# Patient Record
Sex: Female | Born: 1945 | Race: White | Hispanic: No | Marital: Married | Smoking: Never smoker
Health system: Southern US, Community
[De-identification: ages and names within clinical notes are randomized; demographics above are authoritative.]

## PROBLEM LIST (undated history)

## (undated) DIAGNOSIS — F419 Anxiety disorder, unspecified: Secondary | ICD-10-CM

## (undated) DIAGNOSIS — K5790 Diverticulosis of intestine, part unspecified, without perforation or abscess without bleeding: Secondary | ICD-10-CM

## (undated) DIAGNOSIS — K219 Gastro-esophageal reflux disease without esophagitis: Secondary | ICD-10-CM

## (undated) DIAGNOSIS — E785 Hyperlipidemia, unspecified: Secondary | ICD-10-CM

## (undated) HISTORY — PX: COLONOSCOPY: SHX174

## (undated) HISTORY — PX: APPENDECTOMY: SHX54

## (undated) HISTORY — PX: ABDOMINAL HYSTERECTOMY: SHX81

---

## 1986-11-28 HISTORY — PX: BREAST CYST ASPIRATION: SHX578

## 2005-08-11 ENCOUNTER — Other Ambulatory Visit: Payer: Self-pay

## 2005-08-11 ENCOUNTER — Emergency Department: Payer: Self-pay | Admitting: Emergency Medicine

## 2006-02-08 ENCOUNTER — Ambulatory Visit: Payer: Self-pay | Admitting: Unknown Physician Specialty

## 2007-05-28 ENCOUNTER — Ambulatory Visit: Payer: Self-pay | Admitting: Unknown Physician Specialty

## 2008-05-24 ENCOUNTER — Other Ambulatory Visit: Payer: Self-pay

## 2008-05-24 ENCOUNTER — Emergency Department: Payer: Self-pay | Admitting: Emergency Medicine

## 2008-07-31 ENCOUNTER — Ambulatory Visit: Payer: Self-pay | Admitting: Unknown Physician Specialty

## 2008-09-01 ENCOUNTER — Observation Stay: Payer: Self-pay | Admitting: Internal Medicine

## 2008-09-01 ENCOUNTER — Other Ambulatory Visit: Payer: Self-pay

## 2008-09-01 ENCOUNTER — Ambulatory Visit: Payer: Self-pay | Admitting: Family Medicine

## 2009-10-27 ENCOUNTER — Ambulatory Visit: Payer: Self-pay | Admitting: Unknown Physician Specialty

## 2010-09-29 ENCOUNTER — Ambulatory Visit: Payer: Self-pay | Admitting: Unknown Physician Specialty

## 2010-11-25 ENCOUNTER — Ambulatory Visit: Payer: Self-pay | Admitting: Internal Medicine

## 2010-12-14 ENCOUNTER — Ambulatory Visit: Payer: Self-pay | Admitting: Unknown Physician Specialty

## 2011-03-24 ENCOUNTER — Ambulatory Visit: Payer: Self-pay | Admitting: Internal Medicine

## 2011-10-15 ENCOUNTER — Ambulatory Visit: Payer: Self-pay

## 2012-03-02 ENCOUNTER — Ambulatory Visit: Payer: Self-pay | Admitting: Obstetrics and Gynecology

## 2012-03-07 ENCOUNTER — Ambulatory Visit: Payer: Self-pay | Admitting: Obstetrics and Gynecology

## 2012-09-10 ENCOUNTER — Ambulatory Visit: Payer: Self-pay | Admitting: Obstetrics and Gynecology

## 2013-03-09 ENCOUNTER — Emergency Department: Payer: Self-pay | Admitting: Emergency Medicine

## 2013-03-11 ENCOUNTER — Emergency Department: Payer: Self-pay | Admitting: Emergency Medicine

## 2013-03-20 ENCOUNTER — Emergency Department: Payer: Self-pay | Admitting: Emergency Medicine

## 2013-09-19 ENCOUNTER — Ambulatory Visit: Payer: Self-pay | Admitting: Obstetrics and Gynecology

## 2014-10-22 ENCOUNTER — Ambulatory Visit: Payer: Self-pay | Admitting: Obstetrics and Gynecology

## 2015-10-07 ENCOUNTER — Other Ambulatory Visit: Payer: Self-pay | Admitting: Obstetrics and Gynecology

## 2015-10-07 DIAGNOSIS — Z1231 Encounter for screening mammogram for malignant neoplasm of breast: Secondary | ICD-10-CM

## 2015-10-27 ENCOUNTER — Ambulatory Visit: Payer: Self-pay

## 2015-11-03 ENCOUNTER — Other Ambulatory Visit: Payer: Self-pay | Admitting: Obstetrics and Gynecology

## 2015-11-03 ENCOUNTER — Ambulatory Visit
Admission: RE | Admit: 2015-11-03 | Discharge: 2015-11-03 | Disposition: A | Discharge status: Home / Self Care | Payer: Medicare Other | Source: Ambulatory Visit | Attending: Obstetrics and Gynecology | Admitting: Obstetrics and Gynecology

## 2015-11-03 DIAGNOSIS — Z1231 Encounter for screening mammogram for malignant neoplasm of breast: Secondary | ICD-10-CM | POA: Diagnosis present

## 2016-11-07 ENCOUNTER — Encounter: Payer: Self-pay | Admitting: Obstetrics and Gynecology

## 2016-11-07 DIAGNOSIS — Z1231 Encounter for screening mammogram for malignant neoplasm of breast: Secondary | ICD-10-CM

## 2016-12-12 ENCOUNTER — Ambulatory Visit
Admission: RE | Admit: 2016-12-12 | Discharge: 2016-12-12 | Disposition: A | Discharge status: Home / Self Care | Payer: Medicare Other | Source: Ambulatory Visit | Attending: Obstetrics and Gynecology | Admitting: Obstetrics and Gynecology

## 2016-12-12 DIAGNOSIS — Z1231 Encounter for screening mammogram for malignant neoplasm of breast: Secondary | ICD-10-CM

## 2017-01-16 NOTE — Progress Notes (Signed)
This encounter was created in error - please disregard.

## 2017-07-14 ENCOUNTER — Ambulatory Visit
Admission: EM | Admit: 2017-07-14 | Discharge: 2017-07-14 | Disposition: A | Discharge status: Other Acute Inpatient Hospital | Payer: Medicare Other | Attending: Family Medicine | Admitting: Family Medicine

## 2017-07-14 ENCOUNTER — Emergency Department
Admission: EM | Admit: 2017-07-14 | Discharge: 2017-07-14 | Disposition: A | Discharge status: Home / Self Care | Payer: Medicare Other | Attending: Student in an Organized Health Care Education/Training Program | Admitting: Student in an Organized Health Care Education/Training Program

## 2017-07-14 ENCOUNTER — Encounter: Payer: Self-pay | Admitting: *Deleted

## 2017-07-14 ENCOUNTER — Emergency Department: Payer: Medicare Other

## 2017-07-14 DIAGNOSIS — R1031 Right lower quadrant pain: Secondary | ICD-10-CM | POA: Diagnosis present

## 2017-07-14 DIAGNOSIS — K5732 Diverticulitis of large intestine without perforation or abscess without bleeding: Secondary | ICD-10-CM | POA: Insufficient documentation

## 2017-07-14 DIAGNOSIS — R1030 Lower abdominal pain, unspecified: Secondary | ICD-10-CM

## 2017-07-14 DIAGNOSIS — R197 Diarrhea, unspecified: Secondary | ICD-10-CM

## 2017-07-14 LAB — URINALYSIS, COMPLETE (UACMP) WITH MICROSCOPIC
BILIRUBIN URINE: NEGATIVE
Bacteria, UA: NONE SEEN
GLUCOSE, UA: NEGATIVE mg/dL
HGB URINE DIPSTICK: NEGATIVE
Ketones, ur: 5 mg/dL — AB
NITRITE: NEGATIVE
PH: 8 (ref 5.0–8.0)
Protein, ur: NEGATIVE mg/dL
SPECIFIC GRAVITY, URINE: 1.018 (ref 1.005–1.030)

## 2017-07-14 LAB — COMPREHENSIVE METABOLIC PANEL
ALBUMIN: 4.1 g/dL (ref 3.5–5.0)
ALK PHOS: 61 U/L (ref 38–126)
ALT: 19 U/L (ref 14–54)
ANION GAP: 10 (ref 5–15)
AST: 21 U/L (ref 15–41)
BILIRUBIN TOTAL: 0.4 mg/dL (ref 0.3–1.2)
BUN: 19 mg/dL (ref 6–20)
CALCIUM: 9.5 mg/dL (ref 8.9–10.3)
CO2: 27 mmol/L (ref 22–32)
CREATININE: 0.73 mg/dL (ref 0.44–1.00)
Chloride: 99 mmol/L — ABNORMAL LOW (ref 101–111)
GFR calc Af Amer: >60 mL/min (ref >60)
GFR calc non Af Amer: >60 mL/min (ref >60)
GLUCOSE: 115 mg/dL — AB (ref 65–99)
Potassium: 3.9 mmol/L (ref 3.5–5.1)
SODIUM: 136 mmol/L (ref 135–145)
TOTAL PROTEIN: 8.1 g/dL (ref 6.5–8.1)

## 2017-07-14 LAB — CBC
HCT: 39.1 % (ref 35.0–47.0)
HEMOGLOBIN: 12.7 g/dL (ref 12.0–16.0)
MCH: 30 pg (ref 26.0–34.0)
MCHC: 32.5 g/dL (ref 32.0–36.0)
MCV: 92.2 fL (ref 80.0–100.0)
PLATELETS: 372 10*3/uL (ref 150–440)
RBC: 4.24 MIL/uL (ref 3.80–5.20)
RDW: 14.7 % — AB (ref 11.5–14.5)
WBC: 20.4 10*3/uL — ABNORMAL HIGH (ref 3.6–11.0)

## 2017-07-14 LAB — LIPASE, BLOOD: Lipase: 28 U/L (ref 11–51)

## 2017-07-14 MED ORDER — IOPAMIDOL (ISOVUE-300) INJECTION 61%
100.0000 mL | Freq: Once | INTRAVENOUS | Status: AC | PRN
  Administered 2017-07-14: 100 mL via INTRAVENOUS

## 2017-07-14 MED ORDER — IOPAMIDOL (ISOVUE-M 300) INJECTION 61%: 15.0000 mL | Freq: Once | INTRAMUSCULAR | Status: DC | PRN

## 2017-07-14 MED ORDER — METRONIDAZOLE 500 MG PO TABS
500.0000 mg | ORAL_TABLET | Freq: Once | ORAL | Status: AC
  Administered 2017-07-14: 500 mg via ORAL
  Filled 2017-07-14: qty 1

## 2017-07-14 MED ORDER — SODIUM CHLORIDE 0.9 % IV BOLUS (SEPSIS)
500.0000 mL | Freq: Once | INTRAVENOUS | Status: AC
  Administered 2017-07-14: 500 mL via INTRAVENOUS

## 2017-07-14 MED ORDER — KETOROLAC TROMETHAMINE 60 MG/2ML IM SOLN
60.0000 mg | Freq: Once | INTRAMUSCULAR | Status: AC
  Administered 2017-07-14: 60 mg via INTRAMUSCULAR

## 2017-07-14 MED ORDER — HYDROCODONE-ACETAMINOPHEN 5-325 MG PO TABS
1.0000 | ORAL_TABLET | ORAL | 0 refills | Status: DC | PRN
Start: 2017-07-14 — End: 2019-07-21

## 2017-07-14 MED ORDER — CIPROFLOXACIN HCL 500 MG PO TABS
500.0000 mg | ORAL_TABLET | Freq: Once | ORAL | Status: AC
  Administered 2017-07-14: 500 mg via ORAL
  Filled 2017-07-14: qty 1

## 2017-07-14 MED ORDER — METRONIDAZOLE 250 MG PO TABS: 250.0000 mg | ORAL_TABLET | Freq: Three times a day (TID) | ORAL | 0 refills | Status: AC

## 2017-07-14 MED ORDER — CIPROFLOXACIN HCL 500 MG PO TABS: 500.0000 mg | ORAL_TABLET | Freq: Two times a day (BID) | ORAL | 0 refills | Status: AC

## 2017-07-14 MED ORDER — ONDANSETRON HCL 4 MG PO TABS: 4.0000 mg | ORAL_TABLET | Freq: Every day | ORAL | 0 refills | Status: AC | PRN

## 2017-07-14 NOTE — ED Triage Notes (Signed)
Pt states abdominal cramping started x 11 days ago. She had diarrhea resolved after taking imodium and last episode was a couple of days ago. Pt states that she has had persistent, painful cramping that as not resolved w/ taking OTC tylenol. Pt has recently taking augmentin intermittently for sinus infection which she states she has 3-4 times a summer. Pt did not take full course of abx that was rx'd to her but takes them PRN when she feels badly. Pt also was treated w/ prednisone for same sinus infection. Pt has a ride, A&Ox 4, in some distress. Seen at [facility] UC today for same complaint and given medication for pain which has helped.

## 2017-07-14 NOTE — ED Provider Notes (Signed)
[facility] Emergency Department Provider Note    First MD Initiated Contact with Patient 07/14/17 2052     (approximate)  I have reviewed the triage vital signs and the nursing notes.   HISTORY  Chief Complaint Abdominal Pain    HPI Rebecca Morris is a 71 y.o. female resents with chief complaint of crampy right-sided abdominal pain associated with intermittent diarrhea. No constipation. No measured fevers. Patient was recently started on prednisone taper for sinusitis.She states that she has not noted any sort of change in the character of pain with moving her bowels but does state the pain is worse after eating. Denies any dysuria or hematuria.   History reviewed. No pertinent past medical history. Family History  Problem Relation Age of Onset  . Breast cancer Mother 16   Past Surgical History:  Procedure Laterality Date  . ABDOMINAL HYSTERECTOMY    . BREAST CYST ASPIRATION Left 1988   asp cyst   There are no active problems to display for this patient.     Prior to Admission medications   Not on File    Allergies Macrodantin [nitrofurantoin macrocrystal]    Social History Social History  Substance Use Topics  . Smoking status: Never Smoker  . Smokeless tobacco: Never Used  . Alcohol use No    Review of Systems Patient denies headaches, rhinorrhea, blurry vision, numbness, shortness of breath, chest pain, edema, cough, abdominal pain, nausea, vomiting, diarrhea, dysuria, fevers, rashes or hallucinations unless otherwise stated above in HPI. ____________________________________________   PHYSICAL EXAM:  VITAL SIGNS: Vitals:   07/14/17 2100 07/14/17 2146  BP: (!) 146/61 (!) 154/65  Pulse: 90 86  Resp: 18 18  Temp:    SpO2: 99% 100%    Constitutional: Alert and oriented. Well appearing and in no acute distress. Eyes: Conjunctivae are normal.  Head: Atraumatic. Nose: No congestion/rhinnorhea. Mouth/Throat: Mucous  membranes are moist.   Neck: No stridor. Painless ROM.  Cardiovascular: Normal rate, regular rhythm. Grossly normal heart sounds.  Good peripheral circulation. Respiratory: Normal respiratory effort.  No retractions. Lungs CTAB. Gastrointestinal: Soft with + right sided abdominal ttp. No distention. No abdominal bruits. No CVA tenderness. Genitourinary:  Musculoskeletal: No lower extremity tenderness nor edema.  No joint effusions. Neurologic:  Normal speech and language. No gross focal neurologic deficits are appreciated. No facial droop Skin:  Skin is warm, dry and intact. No rash noted. Psychiatric: Mood and affect are normal. Speech and behavior are normal.  ____________________________________________   LABS (all labs ordered are listed, but only abnormal results are displayed)  Results for orders placed or performed during the hospital encounter of 07/14/17 (from the past 24 hour(s))  Lipase, blood     Status: None   Collection Time: 07/14/17  7:27 PM  Result Value Ref Range   Lipase 28 11 - 51 U/L  Comprehensive metabolic panel     Status: Abnormal   Collection Time: 07/14/17  7:27 PM  Result Value Ref Range   Sodium 136 135 - 145 mmol/L   Potassium 3.9 3.5 - 5.1 mmol/L   Chloride 99 (L) 101 - 111 mmol/L   CO2 27 22 - 32 mmol/L   Glucose, Bld 115 (H) 65 - 99 mg/dL   BUN 19 6 - 20 mg/dL   Creatinine, Ser 2.95 0.44 - 1.00 mg/dL   Calcium 9.5 8.9 - 18.8 mg/dL   Total Protein 8.1 6.5 - 8.1 g/dL   Albumin 4.1 3.5 - 5.0 g/dL  AST 21 15 - 41 U/L   ALT 19 14 - 54 U/L   Alkaline Phosphatase 61 38 - 126 U/L   Total Bilirubin 0.4 0.3 - 1.2 mg/dL   GFR calc non Af Amer >60 >60 mL/min   GFR calc Af Amer >60 >60 mL/min   Anion gap 10 5 - 15  CBC     Status: Abnormal   Collection Time: 07/14/17  7:27 PM  Result Value Ref Range   WBC 20.4 (H) 3.6 - 11.0 K/uL   RBC 4.24 3.80 - 5.20 MIL/uL   Hemoglobin 12.7 12.0 - 16.0 g/dL   HCT 00.7 62.2 - 63.3 %   MCV 92.2 80.0 - 100.0 fL    MCH 30.0 26.0 - 34.0 pg   MCHC 32.5 32.0 - 36.0 g/dL   RDW 35.4 (H) 56.2 - 56.3 %   Platelets 372 150 - 440 K/uL  Urinalysis, Complete w Microscopic     Status: Abnormal   Collection Time: 07/14/17  7:27 PM  Result Value Ref Range   Color, Urine YELLOW (A) YELLOW   APPearance CLEAR (A) CLEAR   Specific Gravity, Urine 1.018 1.005 - 1.030   pH 8.0 5.0 - 8.0   Glucose, UA NEGATIVE NEGATIVE mg/dL   Hgb urine dipstick NEGATIVE NEGATIVE   Bilirubin Urine NEGATIVE NEGATIVE   Ketones, ur 5 (A) NEGATIVE mg/dL   Protein, ur NEGATIVE NEGATIVE mg/dL   Nitrite NEGATIVE NEGATIVE   Leukocytes, UA MODERATE (A) NEGATIVE   RBC / HPF 0-5 0 - 5 RBC/hpf   WBC, UA 6-30 0 - 5 WBC/hpf   Bacteria, UA NONE SEEN NONE SEEN   Squamous Epithelial / LPF 0-5 (A) NONE SEEN   ____________________________________________ ____________________________________________  RADIOLOGY  I personally reviewed all radiographic images ordered to evaluate for the above acute complaints and reviewed radiology reports and findings.  These findings were personally discussed with the patient.  Please see medical record for radiology report.  ____________________________________________   PROCEDURES  Procedure(s) performed:  Procedures    Critical Care performed: no ____________________________________________   INITIAL IMPRESSION / ASSESSMENT AND PLAN / ED COURSE  Pertinent labs & imaging results that were available during my care of the patient were reviewed by me and considered in my medical decision making (see chart for details).  DDX: diveticulitis, appy, colitis, stone, pyelo  Rebecca Morris is a 71 y.o. who presents to the ED with symptoms described above. Liver shows evidence of leukocytosis. Patient afebrile and otherwise well appearing. Part of leukocytosis may be secondary to recent prednisone taper. Based on her abdominal exam pain CT imaging ordered for the above differential. No evidence of UTI or stone.  Patient has evidence of diverticulitis without abscess or perforation.  Patient was able to tolerate oral hydration as well as oral antibiotics. Pain was controlled in the ER with oral medications therefore do feel the patient is stable for discharge home.  Have discussed with the patient and available family all diagnostics and treatments performed thus far and all questions were answered to the best of my ability. The patient demonstrates understanding and agreement with plan.       ____________________________________________   FINAL CLINICAL IMPRESSION(S) / ED DIAGNOSES  Final diagnoses:  Right lower quadrant abdominal pain  Diverticulitis large intestine w/o perforation or abscess w/o bleeding      NEW MEDICATIONS STARTED DURING THIS VISIT:  New Prescriptions   No medications on file     Note:  This document was prepared  using Conservation officer, historic buildings and may include unintentional dictation errors.    Willy Eddy, MD 07/14/17 [phone]

## 2017-07-14 NOTE — ED Provider Notes (Signed)
[facility]    CSN: [id number] Arrival date & time: 07/14/17  1748     History   Chief Complaint Chief Complaint  Patient presents with  . Abdominal Pain    HPI Rebecca Morris is a 71 y.o. female.   HPI  This is a 71 year old female who presents with abdominal pain described as cramping that she's had for the last 10 days off and on. She had diarrhea last week which was watery but had some form to it no blood and no mucus. Initially last week she thought she may have had some food poisoning did not have any nausea or vomiting. She's had no fever or chills. Recently she states that she's had no energy and she's afraid to eat solid foods because it precipitates the cramping. Today she awoke without any pain initially but by 8 am the pain started and has persisted. Yesterday had no problems allowing her to  shop most of the day. The pain today has been constant and intense rated as a 9 out of 10. She has been eating instant breakfast and yogurt only. Occasionally she would have buttered toast. Surgical past history is significant for breast cyst aspiration on the left and abdominal hysterectomy         History reviewed. No pertinent past medical history.  There are no active problems to display for this patient.   Past Surgical History:  Procedure Laterality Date  . ABDOMINAL HYSTERECTOMY    . BREAST CYST ASPIRATION Left 1988   asp cyst    OB History    No data available       Home Medications    Prior to Admission medications   Not on File    Family History Family History  Problem Relation Age of Onset  . Breast cancer Mother 5    Social History Social History  Substance Use Topics  . Smoking status: Never Smoker  . Smokeless tobacco: Never Used  . Alcohol use No     Allergies   Macrodantin [nitrofurantoin macrocrystal]   Review of Systems Review of Systems  Constitutional: Positive for activity change, appetite change and fatigue.  Negative for chills, diaphoresis and fever.  Gastrointestinal: Positive for abdominal pain and diarrhea. Negative for anal bleeding, blood in stool, constipation, nausea, rectal pain and vomiting.  All other systems reviewed and are negative.    Physical Exam Triage Vital Signs ED Triage Vitals  Enc Vitals Group     BP 07/14/17 1803 (!) 152/68     Pulse Rate 07/14/17 1803 (!) 102     Resp 07/14/17 1803 15     Temp 07/14/17 1803 97.7 F (36.5 C)     Temp Source 07/14/17 1803 Oral     SpO2 07/14/17 1803 100 %     Weight 07/14/17 1801 147 lb (66.7 kg)     Height 07/14/17 1801 5\' 4"  (1.626 m)     Head Circumference --      Peak Flow --      Pain Score 07/14/17 1801 8     Pain Loc --      Pain Edu? --      Excl. in GC? --    No data found.   Updated Vital Signs BP (!) 152/68 (BP Location: Left Arm)   Pulse (!) 102   Temp 97.7 F (36.5 C) (Oral)   Resp 15   Ht 5\' 4"  (1.626 m)   Wt 147 lb (66.7 kg)  SpO2 100%   BMI 25.23 kg/m   Visual Acuity Right Eye Distance:   Left Eye Distance:   Bilateral Distance:    Right Eye Near:   Left Eye Near:    Bilateral Near:     Physical Exam  Constitutional: She is oriented to person, place, and time. She appears well-developed and well-nourished. No distress.  HENT:  Head: Normocephalic.  Eyes: Pupils are equal, round, and reactive to light.  Neck: Normal range of motion.  Abdominal: Soft. She exhibits no mass. There is tenderness. There is no rebound and no guarding.  Patient has tenderness to palpation over the left and right lower quadrants. There is no rebound and no guarding present.  Musculoskeletal: Normal range of motion.  Neurological: She is alert and oriented to person, place, and time.  Skin: Skin is warm and dry. She is not diaphoretic.  Psychiatric: She has a normal mood and affect. Her behavior is normal. Judgment and thought content normal.  Nursing note and vitals reviewed.    UC Treatments / Results    Labs (all labs ordered are listed, but only abnormal results are displayed) Labs Reviewed - No data to display  EKG  EKG Interpretation None       Radiology No results found.  Procedures Procedures (including critical care time)  Medications Ordered in UC Medications  ketorolac (TORADOL) injection 60 mg (60 mg Intramuscular Given 07/14/17 1836)     Initial Impression / Assessment and Plan / UC Course  I have reviewed the triage vital signs and the nursing notes.  Pertinent labs & imaging results that were available during my care of the patient were reviewed by me and considered in my medical decision making (see chart for details).     Discussion with the patient and her husband. Because of the pain that she has had with   A constant nature ,and the severity  ,have recommended that she be seen at a higher level of care for a thorough evaluation and treatment. She is agreeable to this. She will be transported by her husband to [facility] ED which she chose. Prior to leaving she was given a Toradol injection 60 mg IM and held in our facility for 20 minutes. Her pain level decreased from a 9-7. She continued having constant pain in the lower abdomen. This was the reason for  transporting her to a higher level. She left our facility in stable condition. Reported to the triage nurse at [facility] of the patient's condition  as well as her being transported by her husband. At the present time the patient does not seem to have an acute abdomen but continues with constant pain.  Final Clinical Impressions(s) / UC Diagnoses   Final diagnoses:  Lower abdominal pain    New Prescriptions There are no discharge medications for this patient.    Controlled Substance Prescriptions [place] Controlled Substance Registry consulted? Not Applicable   Lutricia Feil, PA-C 07/14/17 1916

## 2017-07-14 NOTE — Discharge Instructions (Signed)

## 2017-07-14 NOTE — ED Notes (Signed)
Patient shows no signs of adverse reaction to medication at this time.  

## 2017-07-14 NOTE — ED Triage Notes (Signed)
Patient complains of abdominal pain, nausea, cramps x 10 days. Patient states that she has had diarrhea last week. Patient states that she thought she had food poison last week. Patient states that she has no energy and states that she has not had solid food in over one week. Patient states that the lower abdominal cramping is constant and intense.

## 2017-07-20 ENCOUNTER — Telehealth: Payer: Self-pay | Admitting: Gastroenterology

## 2017-07-20 NOTE — Telephone Encounter (Signed)
Left voice message for patient to call and schedule for a hospital follow up diverticulitis large intesstine and right lower quad pain.

## 2017-07-24 ENCOUNTER — Ambulatory Visit (INDEPENDENT_AMBULATORY_CARE_PROVIDER_SITE_OTHER): Payer: Medicare Other | Admitting: Gastroenterology

## 2017-07-24 ENCOUNTER — Encounter: Payer: Self-pay | Admitting: Gastroenterology

## 2017-07-24 ENCOUNTER — Telehealth: Payer: Self-pay

## 2017-07-24 VITALS — BP 111/75 | HR 96 | Temp 97.5°F | Ht 64.0 in | Wt 150.0 lb

## 2017-07-24 DIAGNOSIS — K5792 Diverticulitis of intestine, part unspecified, without perforation or abscess without bleeding: Secondary | ICD-10-CM | POA: Diagnosis not present

## 2017-07-24 MED ORDER — DICYCLOMINE HCL 10 MG PO CAPS: 10.0000 mg | ORAL_CAPSULE | Freq: Three times a day (TID) | ORAL | 0 refills | Status: DC

## 2017-07-24 NOTE — Addendum Note (Signed)
Addended by: Ardyth Man on: 07/24/2017 03:06 PM   Modules accepted: Orders

## 2017-07-24 NOTE — Addendum Note (Signed)
Addended by: Ardyth Man on: 07/24/2017 03:43 PM   Modules accepted: Orders, SmartSet

## 2017-07-24 NOTE — Telephone Encounter (Signed)
Gastroenterology Pre-Procedure Review  Request Date: 10/18 Requesting Physician: Dr. Tobi Bastos  PATIENT REVIEW QUESTIONS: The patient responded to the following health history questions as indicated:    1. Are you having any GI issues? yes (diverticulitis) 2. Do you have a personal history of Polyps? no 3. Do you have a family history of Colon Cancer or Polyps? no 4. Diabetes Mellitus? no 5. Joint replacements in the past 12 months?no 6. Major health problems in the past 3 months?no 7. Any artificial heart valves, MVP, or defibrillator?no    MEDICATIONS & ALLERGIES:    Patient reports the following regarding taking any anticoagulation/antiplatelet therapy:   Plavix, Coumadin, Eliquis, Xarelto, Lovenox, Pradaxa, Brilinta, or Effient? no Aspirin? no  Patient confirms/reports the following medications:  Current Outpatient Prescriptions  Medication Sig Dispense Refill  . atorvastatin (LIPITOR) 20 MG tablet Take by mouth.    . cetirizine (ZYRTEC) 10 MG tablet Take by mouth.    . ciprofloxacin (CIPRO) 500 MG tablet Take 1 tablet (500 mg total) by mouth 2 (two) times daily. 20 tablet 0  . [employer] BUDESONIDE 32 MCG/ACT nasal spray PLACE 1 SPRAY INTO BOTH NOSTRILS ONCE DAILY.  11  . dicyclomine (BENTYL) 10 MG capsule Take 1 capsule (10 mg total) by mouth 4 (four) times daily -  before meals and at bedtime. 120 capsule 0  . estradiol (ESTRACE) 1 MG tablet Take 1 mg by mouth daily.  1  . FLUoxetine (PROZAC) 20 MG tablet Take 20 mg by mouth daily.  1  . HYDROcodone-acetaminophen (NORCO) 5-325 MG tablet Take 1 tablet by mouth every 4 (four) hours as needed for moderate pain. 6 tablet 0  . ondansetron (ZOFRAN) 4 MG tablet Take 1 tablet (4 mg total) by mouth daily as needed for nausea or vomiting. (Patient not taking: Reported on 07/24/2017) 14 tablet 0   No current facility-administered medications for this visit.     Patient confirms/reports the following allergies:  Allergies  Allergen Reactions  .  Macrodantin [Nitrofurantoin Macrocrystal] Itching    No orders of the defined types were placed in this encounter.   AUTHORIZATION INFORMATION Primary Insurance: 1D#: Group #:  Secondary Insurance: 1D#: Group #:  SCHEDULE INFORMATION: Date: 10/18 Time: Location: [facility]

## 2017-07-24 NOTE — Progress Notes (Signed)
[name] MD, MRCP(U.K) [address]  [address]  [place], [place] [address]  Main: [phone]  Fax: [phone]   Gastroenterology Consultation  Referring Provider:    Emergency room  Primary Care Physician:  Sharee Pimple, CNM Primary Gastroenterologist:  Dr. [name]  Reason for Consultation:     Diverticulitis        HPI:   Rebecca Morris is a 71 y.o. y/o female referred for consultation & management  by Dr. Sharee Pimple, CNM.     She was seen at the ER on 07/14/17 for rt sided abdominal pain. CT scan of the abdomen showed acute diverticulitis of the recto sigmoid junction , WCC was 20.4. Was given a course of Ciprofloxacin and Flagyl for about 10 days, some hydrocodone. Denies any fever. Since Er feels "much better" still has some cramping early this morning , no diarrhea. Been eating soft foods. Last colonoscopy was in 2012 and had no polyps. No family history of colon polyps or cancer. No change in bowel habits, no rectal habits, no significant weight loss. She is retired and was a Engineer, agricultural at [employer].  No NSAID's.    No past medical history on file.  Past Surgical History:  Procedure Laterality Date  . ABDOMINAL HYSTERECTOMY    . BREAST CYST ASPIRATION Left 1988   asp cyst    Prior to Admission medications   Medication Sig Start Date End Date Taking? Authorizing Provider  atorvastatin (LIPITOR) 20 MG tablet Take by mouth. 06/23/17  Yes [provider]  cetirizine (ZYRTEC) 10 MG tablet Take by mouth.   Yes [provider]  ciprofloxacin (CIPRO) 500 MG tablet Take 1 tablet (500 mg total) by mouth 2 (two) times daily. 07/14/17 07/24/17 Yes Willy Eddy, MD  [employer] BUDESONIDE 32 MCG/ACT nasal spray PLACE 1 SPRAY INTO BOTH NOSTRILS ONCE DAILY. 06/30/17  Yes [provider]  estradiol (ESTRACE) 1 MG tablet Take 1 mg by mouth daily. 07/20/17  Yes [provider]  FLUoxetine (PROZAC) 20 MG tablet Take 20 mg by mouth daily.  06/09/17  Yes [provider]  HYDROcodone-acetaminophen (NORCO) 5-325 MG tablet Take 1 tablet by mouth every 4 (four) hours as needed for moderate pain. 07/14/17  Yes Willy Eddy, MD  ondansetron (ZOFRAN) 4 MG tablet Take 1 tablet (4 mg total) by mouth daily as needed for nausea or vomiting. Patient not taking: Reported on 07/24/2017 07/14/17 07/14/18  Willy Eddy, MD    Family History  Problem Relation Age of Onset  . Breast cancer Mother 96     Social History  Substance Use Topics  . Smoking status: Never Smoker  . Smokeless tobacco: Never Used  . Alcohol use No    Allergies as of 07/24/2017 - Review Complete 07/24/2017  Allergen Reaction Noted  . Macrodantin [nitrofurantoin macrocrystal] Itching 07/14/2017    Review of Systems:    All systems reviewed and negative except where noted in HPI.   Physical Exam:  BP 111/75 (BP Location: Left Arm, Patient Position: Sitting, Cuff Size: Normal)   Pulse 96   Temp (!) 97.5 F (36.4 C) (Oral)   Ht 5\' 4"  (1.626 m)   Wt 150 lb (68 kg)   BMI 25.75 kg/m  No LMP recorded. Patient has had a hysterectomy. Psych:  Alert and cooperative. Normal mood and affect. General:   Alert,  Well-developed, well-nourished, pleasant and cooperative in NAD Head:  Normocephalic and atraumatic. Eyes:  Sclera clear, no icterus.  Conjunctiva pink. Ears:  Normal auditory acuity. Nose:  No deformity, discharge, or lesions. Mouth:  No deformity or lesions,oropharynx pink & moist. Neck:  Supple; no masses or thyromegaly. Lungs:  Respirations even and unlabored.  Clear throughout to auscultation.   No wheezes, crackles, or rhonchi. No acute distress. Heart:  Regular rate and rhythm; no murmurs, clicks, rubs, or gallops. Abdomen:  Normal bowel sounds.  No bruits.  Soft, non-tender and non-distended without masses, hepatosplenomegaly or hernias noted.  No guarding or rebound tenderness.    Neurologic:  Alert and oriented x3;  grossly normal  neurologically. Skin:  Intact without significant lesions or rashes. No jaundice. Lymph Nodes:  No significant cervical adenopathy. Psych:  Alert and cooperative. Normal mood and affect.  Imaging Studies: Ct Abdomen Pelvis W Contrast  Result Date: 07/14/2017 CLINICAL DATA:  Abdominal pain and diarrhea EXAM: CT ABDOMEN AND PELVIS WITH CONTRAST TECHNIQUE: Multidetector CT imaging of the abdomen and pelvis was performed using the standard protocol following bolus administration of intravenous contrast. CONTRAST:  ISOVUE-300 IOPAMIDOL (ISOVUE-300) INJECTION 61% COMPARISON:  None. FINDINGS: Lower chest: No pulmonary nodules or pleural effusion. No visible pericardial effusion. Hepatobiliary: Normal hepatic contours and density. No visible biliary dilatation. Normal gallbladder. Pancreas: Normal contours without ductal dilatation. No peripancreatic fluid collection. Spleen: Normal. Adrenals/Urinary Tract: --Adrenal glands: Normal. --Right kidney/ureter: No hydronephrosis or perinephric stranding. No nephrolithiasis. No obstructing ureteral stones. --Left kidney/ureter: No hydronephrosis or perinephric stranding. No nephrolithiasis. No obstructing ureteral stones. --Urinary bladder: Unremarkable. Stomach/Bowel: --Stomach/Duodenum: No hiatal hernia or other gastric abnormality. Normal duodenal course and caliber. --Small bowel: No dilatation or inflammation. --Colon: There is rectosigmoid diverticulosis with acute inflammation at the rectosigmoid junction. No free intraperitoneal air or abscess formation. --Appendix: Normal. Vascular/Lymphatic: Normal course and caliber of the major abdominal vessels. No abdominal or pelvic lymphadenopathy. Reproductive: Status post hysterectomy. No adnexal mass. Musculoskeletal. No bony spinal canal stenosis or focal osseous abnormality. There is a medium-sized disc extrusion with mild superior migration at L4-L5. Moderate left L3-L4 and mild to moderate left L4-L5 neural  foraminal stenosis. Other: None. IMPRESSION: 1. Acute rectosigmoid junction diverticulitis. No free intraperitoneal air or abscess formation. 2. Lower lumbar degenerative disc disease with mild-to-moderate left neural foraminal narrowing at L3-L4 and L4-L5. Electronically Signed   By: Deatra Robinson M.D.   On: 07/14/2017 21:36    Assessment and Plan:   Tarica J Altemose is a 71 y.o. y/o female has been referred for acute diverticulitis. First episode,Plan for a colonoscopy in 8 weeks   I have discussed alternative options, risks & benefits,  which include, but are not limited to, bleeding, infection, perforation,respiratory complication & drug reaction.  The patient agrees with this plan & written consent will be obtained.     Follow up as needed   Dr [name] MD,MRCP(U.K)

## 2017-08-20 ENCOUNTER — Other Ambulatory Visit: Payer: Self-pay | Admitting: Gastroenterology

## 2017-08-20 DIAGNOSIS — K5792 Diverticulitis of intestine, part unspecified, without perforation or abscess without bleeding: Secondary | ICD-10-CM

## 2017-08-23 ENCOUNTER — Ambulatory Visit: Payer: Medicare Other | Admitting: Gastroenterology

## 2017-09-13 ENCOUNTER — Other Ambulatory Visit: Payer: Self-pay

## 2017-09-13 DIAGNOSIS — K5792 Diverticulitis of intestine, part unspecified, without perforation or abscess without bleeding: Secondary | ICD-10-CM

## 2017-09-13 MED ORDER — DICYCLOMINE HCL 10 MG PO CAPS: 10.0000 mg | ORAL_CAPSULE | Freq: Three times a day (TID) | ORAL | 3 refills | Status: DC

## 2017-09-14 ENCOUNTER — Other Ambulatory Visit: Payer: Self-pay

## 2017-09-14 ENCOUNTER — Encounter
Admission: RE | Disposition: A | Discharge status: Home / Self Care | Payer: Self-pay | Source: Ambulatory Visit | Attending: Gastroenterology

## 2017-09-14 ENCOUNTER — Ambulatory Visit: Payer: Medicare Other | Admitting: Anesthesiology

## 2017-09-14 ENCOUNTER — Ambulatory Visit
Admission: RE | Admit: 2017-09-14 | Discharge: 2017-09-14 | Disposition: A | Discharge status: Home / Self Care | Payer: Medicare Other | Source: Ambulatory Visit | Attending: Gastroenterology | Admitting: Gastroenterology

## 2017-09-14 DIAGNOSIS — K5730 Diverticulosis of large intestine without perforation or abscess without bleeding: Secondary | ICD-10-CM | POA: Insufficient documentation

## 2017-09-14 DIAGNOSIS — K219 Gastro-esophageal reflux disease without esophagitis: Secondary | ICD-10-CM | POA: Diagnosis not present

## 2017-09-14 DIAGNOSIS — Z79899 Other long term (current) drug therapy: Secondary | ICD-10-CM | POA: Diagnosis not present

## 2017-09-14 DIAGNOSIS — K621 Rectal polyp: Secondary | ICD-10-CM

## 2017-09-14 DIAGNOSIS — K5792 Diverticulitis of intestine, part unspecified, without perforation or abscess without bleeding: Secondary | ICD-10-CM | POA: Diagnosis not present

## 2017-09-14 HISTORY — PX: COLONOSCOPY WITH PROPOFOL: SHX5780

## 2017-09-14 HISTORY — DX: Gastro-esophageal reflux disease without esophagitis: K21.9

## 2017-09-14 SURGERY — COLONOSCOPY WITH PROPOFOL
Anesthesia: General

## 2017-09-14 MED ORDER — PROPOFOL 500 MG/50ML IV EMUL
INTRAVENOUS | Status: DC | PRN
  Administered 2017-09-14: 150 ug/kg/min via INTRAVENOUS

## 2017-09-14 MED ORDER — SODIUM CHLORIDE 0.9 % IV SOLN
INTRAVENOUS | Status: DC
  Administered 2017-09-14: 07:00:00 via INTRAVENOUS

## 2017-09-14 MED ORDER — PROPOFOL 500 MG/50ML IV EMUL
INTRAVENOUS | Status: AC
  Filled 2017-09-14: qty 50

## 2017-09-14 MED ORDER — LIDOCAINE HCL (PF) 2 % IJ SOLN
INTRAMUSCULAR | Status: AC
  Filled 2017-09-14: qty 10

## 2017-09-14 MED ORDER — PHENYLEPHRINE HCL 10 MG/ML IJ SOLN
INTRAMUSCULAR | Status: AC
  Filled 2017-09-14: qty 1

## 2017-09-14 MED ORDER — PROPOFOL 10 MG/ML IV BOLUS
INTRAVENOUS | Status: DC | PRN
  Administered 2017-09-14: 60 mg via INTRAVENOUS
  Administered 2017-09-14 (×2): 20 mg via INTRAVENOUS

## 2017-09-14 MED ORDER — LIDOCAINE HCL (CARDIAC) 20 MG/ML IV SOLN
INTRAVENOUS | Status: DC | PRN
  Administered 2017-09-14: 50 mg via INTRAVENOUS

## 2017-09-14 NOTE — Anesthesia Post-op Follow-up Note (Signed)
Anesthesia QCDR form completed.        

## 2017-09-14 NOTE — Op Note (Signed)
[facility] Gastroenterology Patient Name: Rebecca Morris Procedure Date: 09/14/2017 7:25 AM MRN: [id number] Account #: [id number] Date of Birth: May 28, 1946 Admit Type: Outpatient Age: [age] Room: [facility] ENDO ROOM 3 Gender: Female Note Status: Finalized Procedure:            Colonoscopy Indications:          Follow-up of diverticulitis Providers:            Wyline MoodKiran Romina Divirgilio MD, MD Referring MD:         Sharee Pimplearon W. Jones CNM, CNM (Referring MD) Medicines:            Monitored Anesthesia Care Complications:        No immediate complications. Procedure:            Pre-Anesthesia Assessment:                       - Prior to the procedure, a History and Physical was                        performed, and patient medications, allergies and                        sensitivities were reviewed. The patient's tolerance of                        previous anesthesia was reviewed.                       - The risks and benefits of the procedure and the                        sedation options and risks were discussed with the                        patient. All questions were answered and informed                        consent was obtained.                       - ASA Grade Assessment: III - A patient with severe                        systemic disease.                       After obtaining informed consent, the colonoscope was                        passed under direct vision. Throughout the procedure,                        the patient's blood pressure, pulse, and oxygen                        saturations were monitored continuously. The                        Colonoscope was introduced through the anus and  advanced to the the cecum, identified by the                        appendiceal orifice, IC valve and transillumination.                        The colonoscopy was performed with ease. The patient                        tolerated the procedure well. The quality of  the bowel                        preparation was fair. Findings:      The perianal and digital rectal examinations were normal.      A single medium-mouthed diverticulum was found in the sigmoid colon.      A 3 mm polyp was found in the rectum. The polyp was sessile. The polyp       was removed with a cold biopsy forceps. Resection and retrieval were       complete.      The exam was otherwise without abnormality on direct and retroflexion       views. Impression:           - Preparation of the colon was fair.                       - Diverticulosis in the sigmoid colon.                       - One 3 mm polyp in the rectum, removed with a cold                        biopsy forceps. Resected and retrieved.                       - The examination was otherwise normal on direct and                        retroflexion views. Recommendation:       - Discharge patient to home (with escort).                       - Resume previous diet.                       - Continue present medications.                       - Await pathology results.                       - The prep was adequate to r/o large polyps or masses                        but inadequate to rule out flat or tiny polyps                        especially in the right colon . She is due for a  colonoscopy in 2019 based on prior report and hence we                        can repeat it in 2019 for evaluation of polyps Procedure Code(s):    --- Professional ---                       [phone], Colonoscopy, flexible; with biopsy, single or                        multiple Diagnosis Code(s):    --- Professional ---                       K62.1, Rectal polyp                       K57.32, Diverticulitis of large intestine without                        perforation or abscess without bleeding                       K57.30, Diverticulosis of large intestine without                        perforation or abscess without  bleeding CPT copyright 2016 American Medical Association. All rights reserved. The codes documented in this report are preliminary and upon coder review may  be revised to meet current compliance requirements. [name], MD [name] MD, MD 09/14/2017 8:07:08 AM This report has been signed electronically. Number of Addenda: 0 Note Initiated On: 09/14/2017 7:25 AM Scope Withdrawal Time: 0 hours 11 minutes 3 seconds  Total Procedure Duration: 0 hours 17 minutes 48 seconds       [facility]

## 2017-09-14 NOTE — Anesthesia Postprocedure Evaluation (Signed)
Anesthesia Post Note  Patient: Rebecca Morris  Procedure(s) Performed: COLONOSCOPY WITH PROPOFOL (N/A )  Patient location during evaluation: Endoscopy Anesthesia Type: General Level of consciousness: awake and alert Pain management: pain level controlled Vital Signs Assessment: post-procedure vital signs reviewed and stable Respiratory status: spontaneous breathing, nonlabored ventilation, respiratory function stable and patient connected to nasal cannula oxygen Cardiovascular status: blood pressure returned to baseline and stable Postop Assessment: no apparent nausea or vomiting Anesthetic complications: no     Last Vitals:  Vitals:   09/14/17 0828 09/14/17 0838  BP: 96/86 132/64  Pulse: 63 65  Resp: 19 17  Temp:    SpO2: 100% 99%    Last Pain:  Vitals:   09/14/17 0808  TempSrc: Tympanic                 Cleda MccreedyJoseph K Kenitra Leventhal

## 2017-09-14 NOTE — Transfer of Care (Signed)
`  Immediate Anesthesia Transfer of Care Note  Patient: Rebecca Morris  Procedure(s) Performed: COLONOSCOPY WITH PROPOFOL (N/A )  Patient Location: PACU  Anesthesia Type:General  Level of Consciousness: sedated and responds to stimulation  Airway & Oxygen Therapy: Patient Spontanous Breathing and Patient connected to nasal cannula oxygen  Post-op Assessment: Report given to RN and Post -op Vital signs reviewed and stable  Post vital signs: Reviewed and stable  Last Vitals:  Vitals:   09/14/17 0705 09/14/17 0809  BP: (!) 151/75 (!) 102/48  Pulse: 82 67  Resp: 16 (!) 9  Temp: 36.4 C   SpO2: 100% 100%    Last Pain:  Vitals:   09/14/17 0705  TempSrc: Oral         Complications: No apparent anesthesia complications

## 2017-09-14 NOTE — H&P (Signed)
Wyline MoodKiran Teshia Mahone MD [address] [place], [place] [address] Phone: [phone] Fax : [phone]  Primary Care Physician:  Sharee PimpleJones, Caron W, CNM Primary Gastroenterologist:  Dr. Wyline MoodKiran Lilliemae Fruge   Pre-Procedure History & Physical: HPI:  Rebecca Morris is a 71 y.o. female is here for an colonoscopy.   Past Medical History:  Diagnosis Date  . GERD (gastroesophageal reflux disease)     Past Surgical History:  Procedure Laterality Date  . ABDOMINAL HYSTERECTOMY    . BREAST CYST ASPIRATION Left 1988   asp cyst  . COLONOSCOPY      Prior to Admission medications   Medication Sig Start Date End Date Taking? Authorizing Provider  estradiol (ESTRACE) 1 MG tablet Take 1 mg by mouth daily. 07/20/17  Yes [provider]  FLUoxetine (PROZAC) 20 MG tablet Take 20 mg by mouth daily. 06/09/17  Yes [provider]  atorvastatin (LIPITOR) 20 MG tablet Take by mouth. 06/23/17   [provider]  cetirizine (ZYRTEC) 10 MG tablet Take by mouth.    [provider]  [facility] BUDESONIDE 32 MCG/ACT nasal spray PLACE 1 SPRAY INTO BOTH NOSTRILS ONCE DAILY. 06/30/17   [provider]  dicyclomine (BENTYL) 10 MG capsule Take 1 capsule (10 mg total) by mouth 4 (four) times daily -  before meals and at bedtime. 09/13/17 10/13/17  Wyline MoodAnna, Gustavo Dispenza, MD  HYDROcodone-acetaminophen (NORCO) 5-325 MG tablet Take 1 tablet by mouth every 4 (four) hours as needed for moderate pain. Patient not taking: Reported on 09/14/2017 07/14/17   Willy Eddyobinson, Patrick, MD  ondansetron (ZOFRAN) 4 MG tablet Take 1 tablet (4 mg total) by mouth daily as needed for nausea or vomiting. Patient not taking: Reported on 07/24/2017 07/14/17 07/14/18  Willy Eddyobinson, Patrick, MD    Allergies as of 07/25/2017 - Review Complete 07/24/2017  Allergen Reaction Noted  . Macrodantin [nitrofurantoin macrocrystal] Itching 07/14/2017    Family History  Problem Relation Age of Onset  . Breast cancer Mother 5560    Social History    Social History  . Marital status: Married    Spouse name: N/A  . Number of children: N/A  . Years of education: N/A   Occupational History  . Not on file.   Social History Main Topics  . Smoking status: Never Smoker  . Smokeless tobacco: Never Used  . Alcohol use No  . Drug use: No  . Sexual activity: Yes    Birth control/ protection: Post-menopausal   Other Topics Concern  . Not on file   Social History Narrative  . No narrative on file    Review of Systems: See HPI, otherwise negative ROS  Physical Exam: BP (!) 151/75   Pulse 82   Temp 97.6 F (36.4 C) (Oral)   Resp 16   Ht 5\' 3"  (1.6 m)   Wt 147 lb (66.7 kg)   SpO2 100%   BMI 26.04 kg/m  General:   Alert,  pleasant and cooperative in NAD Head:  Normocephalic and atraumatic. Neck:  Supple; no masses or thyromegaly. Lungs:  Clear throughout to auscultation.    Heart:  Regular rate and rhythm. Abdomen:  Soft, nontender and nondistended. Normal bowel sounds, without guarding, and without rebound.   Neurologic:  Alert and  oriented x4;  grossly normal neurologically.  Impression/Plan: Rebecca Morris is here for an colonoscopy to be performed for diverticulitis  Risks, benefits, limitations, and alternatives regarding  endoscopy have been reviewed with the patient.  Questions have been answered.  All  parties agreeable.   [name], MD  09/14/2017, 7:41 AM

## 2017-09-14 NOTE — Anesthesia Preprocedure Evaluation (Signed)
Anesthesia Evaluation  Patient identified by MRN, date of birth, ID band Patient awake    Reviewed: Allergy & Precautions, H&P , NPO status , Patient's Chart, lab work & pertinent test results  History of Anesthesia Complications (+) PONV and history of anesthetic complications  Airway Mallampati: III  TM Distance: <3 FB Neck ROM: limited    Dental  (+) Poor Dentition, Chipped, Missing   Pulmonary neg pulmonary ROS, neg shortness of breath,           Cardiovascular Exercise Tolerance: Good (-) angina(-) Past MI and (-) DOE negative cardio ROS       Neuro/Psych negative neurological ROS  negative psych ROS   GI/Hepatic negative GI ROS, Neg liver ROS, GERD  Medicated and Controlled,  Endo/Other  negative endocrine ROS  Renal/GU negative Renal ROS  negative genitourinary   Musculoskeletal   Abdominal   Peds  Hematology negative hematology ROS (+)   Anesthesia Other Findings Past Medical History: No date: GERD (gastroesophageal reflux disease)  Past Surgical History: No date: ABDOMINAL HYSTERECTOMY 1988: BREAST CYST ASPIRATION; Left     Comment:  asp cyst No date: COLONOSCOPY  BMI    Body Mass Index:  26.04 kg/m      Reproductive/Obstetrics negative OB ROS                            Anesthesia Physical Anesthesia Plan  ASA: III  Anesthesia Plan: General   Post-op Pain Management:    Induction: Intravenous  PONV Risk Score and Plan: Propofol infusion  Airway Management Planned: Natural Airway and Nasal Cannula  Additional Equipment:   Intra-op Plan:   Post-operative Plan:   Informed Consent: I have reviewed the patients History and Physical, chart, labs and discussed the procedure including the risks, benefits and alternatives for the proposed anesthesia with the patient or authorized representative who has indicated his/her understanding and acceptance.   Dental  Advisory Given  Plan Discussed with: Anesthesiologist, CRNA and Surgeon  Anesthesia Plan Comments: (Patient consented for risks of anesthesia including but not limited to:  - adverse reactions to medications - risk of intubation if required - damage to teeth, lips or other oral mucosa - sore throat or hoarseness - Damage to heart, brain, lungs or loss of life  Patient voiced understanding.)       Anesthesia Quick Evaluation

## 2017-09-15 LAB — SURGICAL PATHOLOGY

## 2017-09-18 ENCOUNTER — Telehealth: Payer: Self-pay

## 2017-09-18 NOTE — Telephone Encounter (Signed)
LVM for patient callback for results per Dr. Tobi BastosAnna.   Benign polyp taken out, since prep was inadequate recommend repeat colonoscopy in 2019 when she was originally due

## 2017-09-18 NOTE — Telephone Encounter (Signed)
-----   Message from Wyline MoodKiran Anna, MD sent at 09/18/2017 10:07 AM EDT ----- Benign polyp taken out, since prep was inadequate recommend repeat colonoscopy in 2019 when she was originally due

## 2017-09-19 ENCOUNTER — Encounter: Payer: Self-pay | Admitting: Gastroenterology

## 2017-09-25 ENCOUNTER — Other Ambulatory Visit: Payer: Self-pay

## 2017-09-25 DIAGNOSIS — K5792 Diverticulitis of intestine, part unspecified, without perforation or abscess without bleeding: Secondary | ICD-10-CM

## 2017-09-25 MED ORDER — DICYCLOMINE HCL 10 MG PO CAPS: 10.0000 mg | ORAL_CAPSULE | Freq: Three times a day (TID) | ORAL | 0 refills | Status: DC

## 2017-11-15 ENCOUNTER — Other Ambulatory Visit: Payer: Self-pay | Admitting: Gastroenterology

## 2017-11-15 DIAGNOSIS — K5792 Diverticulitis of intestine, part unspecified, without perforation or abscess without bleeding: Secondary | ICD-10-CM

## 2017-12-05 ENCOUNTER — Other Ambulatory Visit: Payer: Self-pay | Admitting: Obstetrics and Gynecology

## 2017-12-05 DIAGNOSIS — Z1231 Encounter for screening mammogram for malignant neoplasm of breast: Secondary | ICD-10-CM

## 2017-12-20 ENCOUNTER — Ambulatory Visit
Admission: RE | Admit: 2017-12-20 | Discharge: 2017-12-20 | Disposition: A | Discharge status: Home / Self Care | Payer: Medicare Other | Source: Ambulatory Visit | Attending: Obstetrics and Gynecology | Admitting: Obstetrics and Gynecology

## 2017-12-20 DIAGNOSIS — Z1231 Encounter for screening mammogram for malignant neoplasm of breast: Secondary | ICD-10-CM | POA: Insufficient documentation

## 2018-01-01 ENCOUNTER — Telehealth: Payer: Self-pay | Admitting: Gastroenterology

## 2018-01-01 NOTE — Telephone Encounter (Signed)
Patient called and states she had a colonoscopy done 09-14-17 by Dr Tobi BastosAnna. She experienced cramping and gas on Saturday 11-29-17 but no diarrhea.She just wanted to let Dr Tobi BastosAnna know.

## 2018-01-10 ENCOUNTER — Other Ambulatory Visit: Payer: Self-pay

## 2018-01-15 ENCOUNTER — Other Ambulatory Visit: Payer: Self-pay | Admitting: Gastroenterology

## 2018-01-15 DIAGNOSIS — K5792 Diverticulitis of intestine, part unspecified, without perforation or abscess without bleeding: Secondary | ICD-10-CM

## 2018-01-28 ENCOUNTER — Other Ambulatory Visit: Payer: Self-pay | Admitting: Gastroenterology

## 2018-01-28 DIAGNOSIS — K5792 Diverticulitis of intestine, part unspecified, without perforation or abscess without bleeding: Secondary | ICD-10-CM

## 2018-04-19 ENCOUNTER — Other Ambulatory Visit: Payer: Self-pay | Admitting: Gastroenterology

## 2018-04-19 DIAGNOSIS — K5792 Diverticulitis of intestine, part unspecified, without perforation or abscess without bleeding: Secondary | ICD-10-CM

## 2018-07-14 ENCOUNTER — Other Ambulatory Visit: Payer: Self-pay | Admitting: Gastroenterology

## 2018-07-14 DIAGNOSIS — K5792 Diverticulitis of intestine, part unspecified, without perforation or abscess without bleeding: Secondary | ICD-10-CM

## 2018-07-19 ENCOUNTER — Other Ambulatory Visit: Payer: Self-pay

## 2018-07-19 DIAGNOSIS — K5792 Diverticulitis of intestine, part unspecified, without perforation or abscess without bleeding: Secondary | ICD-10-CM

## 2018-07-19 NOTE — Telephone Encounter (Signed)
Refill for Dicyclomine has been sent to pt's pharmacy. Left vm letting pt know this was sent.

## 2018-07-19 NOTE — Telephone Encounter (Signed)
Pt left vm she states she needs refill on rx dycyclimine 10 mg 4 times a day send to [place] [facility] please call pt once send

## 2018-09-13 ENCOUNTER — Telehealth: Payer: Self-pay | Admitting: Gastroenterology

## 2018-09-13 NOTE — Telephone Encounter (Signed)
Pt left vm to schedule a f/u colonoscopy with Dr. Tobi Bastos

## 2018-09-14 ENCOUNTER — Telehealth: Payer: Self-pay

## 2018-09-14 NOTE — Telephone Encounter (Signed)
LVM for pt to call office to schedule her repeat colonoscopy with Dr. Tobi Bastos.  Reviewed colonoscopy report from 09/14/17.  This report indicated that pt had Rectal Polyps K62.1.    Thanks [employer]

## 2018-11-08 ENCOUNTER — Telehealth: Payer: Self-pay

## 2018-11-08 NOTE — Telephone Encounter (Signed)
-----   Message from Avie ArenasMichelle S Versie Soave, [place] sent at 09/25/2018  4:53 PM EDT ----- Regarding: Schedule Colonoscopy 2020 Patient would like her repeat colonoscopy with Dr. Tobi BastosAnna scheduled after the beginning of the year.  See chart message for diagnosis code.  Thanks [employer]

## 2018-11-08 NOTE — Telephone Encounter (Signed)
Contacted patient to see if she would like to go ahead and get scheduled for her repeat colonoscopy with Dr. Tobi BastosAnna in January 2020.  Last colonoscopy was on 09/14/17 with Dr. Tobi BastosAnna, he suggested repeat colonoscopy in 2019.  Sessile polyp was noted on colonoscopy (Z86.010).  Thanks [employer]

## 2018-11-12 ENCOUNTER — Other Ambulatory Visit: Payer: Self-pay | Admitting: Obstetrics and Gynecology

## 2018-11-12 DIAGNOSIS — Z1231 Encounter for screening mammogram for malignant neoplasm of breast: Secondary | ICD-10-CM

## 2018-11-14 ENCOUNTER — Telehealth: Payer: Self-pay

## 2018-11-14 NOTE — Telephone Encounter (Signed)
PT left vm for Rebecca Morris returning her call to schedule a colonoscopy for January or feburay

## 2018-11-16 ENCOUNTER — Other Ambulatory Visit: Payer: Self-pay

## 2018-11-16 ENCOUNTER — Telehealth: Payer: Self-pay

## 2018-11-16 DIAGNOSIS — Z86010 Personal history of colonic polyps: Secondary | ICD-10-CM

## 2018-11-16 NOTE — Telephone Encounter (Signed)
Pt is calling  To schedule a recolonoscopy

## 2018-11-16 NOTE — Telephone Encounter (Signed)
LVM returning patients call to schedule her for her repeat colonoscopy with Dr. Tobi BastosAnna due to her history of colon polyps.  Thanks [employer]

## 2018-11-19 NOTE — Telephone Encounter (Signed)
Returned patients phone call LVM for her to call office back regarding rescheduling her colonoscopy.  She is currently scheduled for 12/11/2018.  Thanks [employer]

## 2019-01-10 ENCOUNTER — Ambulatory Visit
Admission: RE | Admit: 2019-01-10 | Discharge: 2019-01-10 | Disposition: A | Discharge status: Home / Self Care | Payer: Medicare Other | Source: Ambulatory Visit | Attending: Obstetrics and Gynecology | Admitting: Obstetrics and Gynecology

## 2019-01-10 DIAGNOSIS — Z1231 Encounter for screening mammogram for malignant neoplasm of breast: Secondary | ICD-10-CM | POA: Diagnosis present

## 2019-01-14 ENCOUNTER — Encounter: Admission: RE | Payer: Self-pay | Source: Home / Self Care

## 2019-01-14 ENCOUNTER — Ambulatory Visit: Admission: RE | Admit: 2019-01-14 | Payer: Medicare Other | Source: Home / Self Care | Admitting: Gastroenterology

## 2019-01-14 ENCOUNTER — Other Ambulatory Visit: Payer: Self-pay | Admitting: Obstetrics and Gynecology

## 2019-01-14 ENCOUNTER — Telehealth: Payer: Self-pay | Admitting: Gastroenterology

## 2019-01-14 DIAGNOSIS — R928 Other abnormal and inconclusive findings on diagnostic imaging of breast: Secondary | ICD-10-CM

## 2019-01-14 DIAGNOSIS — N6489 Other specified disorders of breast: Secondary | ICD-10-CM

## 2019-01-14 SURGERY — COLONOSCOPY WITH PROPOFOL
Anesthesia: General

## 2019-01-14 NOTE — Telephone Encounter (Signed)
Noted. [facility] has been notified.  

## 2019-01-14 NOTE — Telephone Encounter (Signed)
Pt left vm to cancel her procedure for 01/14/19 she has a bug she caught due to working part time she states she will call us back to r/s

## 2019-01-16 ENCOUNTER — Telehealth: Payer: Self-pay | Admitting: Gastroenterology

## 2019-01-16 NOTE — Telephone Encounter (Signed)
Pt left vm she states she needs to r/s her procedure she cancelled due to the flu please call her to r/s this

## 2019-01-21 ENCOUNTER — Ambulatory Visit
Admission: RE | Admit: 2019-01-21 | Discharge: 2019-01-21 | Disposition: A | Discharge status: Home / Self Care | Payer: Medicare Other | Source: Ambulatory Visit | Attending: Obstetrics and Gynecology | Admitting: Obstetrics and Gynecology

## 2019-01-21 DIAGNOSIS — R928 Other abnormal and inconclusive findings on diagnostic imaging of breast: Secondary | ICD-10-CM | POA: Diagnosis present

## 2019-01-21 DIAGNOSIS — N6489 Other specified disorders of breast: Secondary | ICD-10-CM | POA: Insufficient documentation

## 2019-01-31 NOTE — Telephone Encounter (Signed)
Called pt regarding her request to reschedule her colonoscopy.  Unable to contact, LVM to return call

## 2019-02-01 NOTE — Telephone Encounter (Signed)
Called pt to inform her that I will need to speak with her directly to agree on an exact date for her procedure.  Unable to contact, LVM to return call

## 2019-02-01 NOTE — Telephone Encounter (Signed)
PATIENT RETURNED CALL TO R/S COLONOSCOPY WOULD LIKE TO SCHEDULE AFTER April. JUST LEAVE DETAILED MESSAGE ON PHONE. sHE WILL NEED NEW SCRIPT FOR BOWEL PREP [facility] & NEEDS INSTRUCTIONS

## 2019-02-05 ENCOUNTER — Other Ambulatory Visit: Payer: Self-pay | Admitting: Gastroenterology

## 2019-02-05 DIAGNOSIS — K5792 Diverticulitis of intestine, part unspecified, without perforation or abscess without bleeding: Secondary | ICD-10-CM

## 2019-02-11 NOTE — Telephone Encounter (Signed)
Pt left vm for Nurse to call her to r/s her procedure she would like to r/s after all this virsus stuff has settled please call pt

## 2019-07-21 ENCOUNTER — Encounter: Payer: Self-pay | Admitting: Emergency Medicine

## 2019-07-21 ENCOUNTER — Other Ambulatory Visit: Payer: Self-pay

## 2019-07-21 ENCOUNTER — Ambulatory Visit
Admission: EM | Admit: 2019-07-21 | Discharge: 2019-07-21 | Disposition: A | Discharge status: Home / Self Care | Payer: Medicare Other | Attending: Emergency Medicine | Admitting: Emergency Medicine

## 2019-07-21 DIAGNOSIS — R1032 Left lower quadrant pain: Secondary | ICD-10-CM

## 2019-07-21 DIAGNOSIS — Z20828 Contact with and (suspected) exposure to other viral communicable diseases: Secondary | ICD-10-CM | POA: Diagnosis not present

## 2019-07-21 DIAGNOSIS — K5732 Diverticulitis of large intestine without perforation or abscess without bleeding: Secondary | ICD-10-CM

## 2019-07-21 DIAGNOSIS — Z79899 Other long term (current) drug therapy: Secondary | ICD-10-CM | POA: Diagnosis not present

## 2019-07-21 DIAGNOSIS — Z803 Family history of malignant neoplasm of breast: Secondary | ICD-10-CM | POA: Diagnosis not present

## 2019-07-21 DIAGNOSIS — K219 Gastro-esophageal reflux disease without esophagitis: Secondary | ICD-10-CM | POA: Diagnosis not present

## 2019-07-21 LAB — COMPREHENSIVE METABOLIC PANEL
ALT: 25 U/L (ref 0–44)
AST: 25 U/L (ref 15–41)
Albumin: 3.8 g/dL (ref 3.5–5.0)
Alkaline Phosphatase: 78 U/L (ref 38–126)
Anion gap: 11 (ref 5–15)
BUN: 28 mg/dL — ABNORMAL HIGH (ref 8–23)
CO2: 24 mmol/L (ref 22–32)
Calcium: 9.2 mg/dL (ref 8.9–10.3)
Chloride: 102 mmol/L (ref 98–111)
Creatinine, Ser: 0.92 mg/dL (ref 0.44–1.00)
GFR calc Af Amer: >60 mL/min (ref >60)
GFR calc non Af Amer: >60 mL/min (ref >60)
Glucose, Bld: 99 mg/dL (ref 70–99)
Potassium: 4.3 mmol/L (ref 3.5–5.1)
Sodium: 137 mmol/L (ref 135–145)
Total Bilirubin: 0.3 mg/dL (ref 0.3–1.2)
Total Protein: 8 g/dL (ref 6.5–8.1)

## 2019-07-21 LAB — URINALYSIS, COMPLETE (UACMP) WITH MICROSCOPIC
Bacteria, UA: NONE SEEN
Bilirubin Urine: NEGATIVE
Glucose, UA: NEGATIVE mg/dL
Hgb urine dipstick: NEGATIVE
Ketones, ur: NEGATIVE mg/dL
Nitrite: NEGATIVE
Protein, ur: NEGATIVE mg/dL
RBC / HPF: NONE SEEN RBC/hpf (ref 0–5)
Specific Gravity, Urine: 1.015 (ref 1.005–1.030)
pH: 6 (ref 5.0–8.0)

## 2019-07-21 LAB — CBC WITH DIFFERENTIAL/PLATELET
Abs Immature Granulocytes: 0.04 10*3/uL (ref 0.00–0.07)
Basophils Absolute: 0.1 10*3/uL (ref 0.0–0.1)
Basophils Relative: 0 %
Eosinophils Absolute: 0.1 10*3/uL (ref 0.0–0.5)
Eosinophils Relative: 1 %
HCT: 39.2 % (ref 36.0–46.0)
Hemoglobin: 12.9 g/dL (ref 12.0–15.0)
Immature Granulocytes: 0 %
Lymphocytes Relative: 12 %
Lymphs Abs: 1.8 10*3/uL (ref 0.7–4.0)
MCH: 31.6 pg (ref 26.0–34.0)
MCHC: 32.9 g/dL (ref 30.0–36.0)
MCV: 96.1 fL (ref 80.0–100.0)
Monocytes Absolute: 1.1 10*3/uL — ABNORMAL HIGH (ref 0.1–1.0)
Monocytes Relative: 7 %
Neutro Abs: 11.4 10*3/uL — ABNORMAL HIGH (ref 1.7–7.7)
Neutrophils Relative %: 80 %
Platelets: 357 10*3/uL (ref 150–400)
RBC: 4.08 MIL/uL (ref 3.87–5.11)
RDW: 13.7 % (ref 11.5–15.5)
WBC: 14.5 10*3/uL — ABNORMAL HIGH (ref 4.0–10.5)
nRBC: 0 % (ref 0.0–0.2)

## 2019-07-21 MED ORDER — AMOXICILLIN-POT CLAVULANATE 875-125 MG PO TABS: 1.0000 | ORAL_TABLET | Freq: Two times a day (BID) | ORAL | 0 refills | Status: DC

## 2019-07-21 NOTE — ED Triage Notes (Signed)
Patient states that she was treated for Sinus Infection on 07/02/19.  Patient states that her last dose of antibiotic will be tomorrow.  Patient also reports left sided abdominal pain on Tuesday.  Patient reports history of diverticulitis.  Patient denies N/V/D.  Patient reports loss of appetite.  Patient denies fevers.

## 2019-07-21 NOTE — ED Provider Notes (Signed)
[facility]    CSN: [id number] Arrival date & time: 07/21/19  1018      History   Chief Complaint Chief Complaint  Patient presents with  . Fatigue    HPI Rebecca Morris is a 73 y.o. female.   HPI  73 year old female presents with 2 separate issues.  First issue is that sinusitis that she had developed in early August around the fourth or fifth but put off having a treated tool just recently.  [facility] and was prescribed amoxicillin 875 twice daily.  She has 1 more day of therapy left.  She states that despite the antibiotic she still does not feel well at all complaining mostly of fatigue but denies any coughing or significance sneezing.  States that the symptoms that she has had has been very similar to previous sinus infections.  Had no fever or chills.  Her second issue is that of left lower quadrant discomfort that happens on occasion but is not long lasting.  She has been having normal bowel movements.  She denies any hematochezia or melena.  She has not had any constipation.  Her last bowel movement was earlier this morning which she classifies as normal.  She has a history of diverticulitis that has been treated in the past with Cipro and Flagyl.  Is on a anti-diverticulitis type dietary regimen.  Follows this closely.  Symptoms developed on Tuesday 5 days prior to this visit.  Pain does not last long is very sharp.  She has had some mild nausea on one occasion but has had no vomiting.  Again she has not had any fevers.  She does report a loss of appetite.        Past Medical History:  Diagnosis Date  . GERD (gastroesophageal reflux disease)     There are no active problems to display for this patient.   Past Surgical History:  Procedure Laterality Date  . ABDOMINAL HYSTERECTOMY    . APPENDECTOMY    . BREAST CYST ASPIRATION Left 1988   asp cyst  . COLONOSCOPY    . COLONOSCOPY WITH PROPOFOL N/A 09/14/2017   Procedure: COLONOSCOPY  WITH PROPOFOL;  Surgeon: Wyline MoodAnna, Kiran, MD;  [facility];  Service: Gastroenterology;  Laterality: N/A;    OB History   No obstetric history on file.      Home Medications    Prior to Admission medications   Medication Sig Start Date End Date Taking? Authorizing Provider  atorvastatin (LIPITOR) 20 MG tablet Take by mouth. 06/23/17  Yes [provider]  cetirizine (ZYRTEC) 10 MG tablet Take by mouth.   Yes [provider]  [facility] BUDESONIDE 32 MCG/ACT nasal spray PLACE 1 SPRAY INTO BOTH NOSTRILS ONCE DAILY. 06/30/17  Yes [provider]  dicyclomine (BENTYL) 10 MG capsule TAKE 1 CAPSULE (10 MG TOTAL) BY MOUTH 4 (FOUR) TIMES DAILY - BEFORE MEALS AND AT BEDTIME. 02/12/19 08/11/19 Yes Wyline MoodAnna, Kiran, MD  estradiol (ESTRACE) 1 MG tablet Take 1 mg by mouth daily. 07/20/17  Yes [provider]  FLUoxetine (PROZAC) 20 MG tablet Take 20 mg by mouth daily. 06/09/17  Yes [provider]  Multiple Vitamin (MULTIVITAMIN) tablet Take 1 tablet by mouth daily.   Yes [provider]  amoxicillin-clavulanate (AUGMENTIN) 875-125 MG tablet Take 1 tablet by mouth every 12 (twelve) hours. 07/21/19   Lutricia Feiloemer, Darden Flemister P, PA-C  dicyclomine (BENTYL) 10 MG capsule TAKE 1 CAPSULE (10 MG TOTAL) BY MOUTH 4 (FOUR)  TIMES DAILY - BEFORE MEALS AND AT BEDTIME. 11/15/17 12/15/17  Wyline MoodAnna, Kiran, MD  dicyclomine (BENTYL) 10 MG capsule Take 1 capsule (10 mg total) by mouth at bedtime as needed for spasms. 01/29/18 02/28/18  Wyline MoodAnna, Kiran, MD    Family History Family History  Problem Relation Age of Onset  . Breast cancer Mother 1960    Social History Social History   Tobacco Use  . Smoking status: Never Smoker  . Smokeless tobacco: Never Used  Substance Use Topics  . Alcohol use: No  . Drug use: No     Allergies   Macrodantin [nitrofurantoin macrocrystal]   Review of Systems Review of Systems  Constitutional: Positive for activity change, appetite change and fatigue.  Negative for chills and fever.  Respiratory: Negative for cough and shortness of breath.   Gastrointestinal: Positive for abdominal pain. Negative for abdominal distention, anal bleeding, blood in stool, constipation, diarrhea, nausea and vomiting.  All other systems reviewed and are negative.    Physical Exam Triage Vital Signs ED Triage Vitals  Enc Vitals Group     BP 07/21/19 1032 129/88     Pulse Rate 07/21/19 1032 94     Resp 07/21/19 1032 14     Temp 07/21/19 1032 98.7 F (37.1 C)     Temp Source 07/21/19 1032 Oral     SpO2 07/21/19 1032 98 %     Weight 07/21/19 1029 150 lb (68 kg)     Height 07/21/19 1029 5\' 4"  (1.626 m)     Head Circumference --      Peak Flow --      Pain Score 07/21/19 1029 1     Pain Loc --      Pain Edu? --      Excl. in GC? --    No data found.  Updated Vital Signs BP 129/88 (BP Location: Left Arm)   Pulse 94   Temp 98.7 F (37.1 C) (Oral)   Resp 14   Ht 5\' 4"  (1.626 m)   Wt 150 lb (68 kg)   SpO2 98%   BMI 25.75 kg/m   Visual Acuity Right Eye Distance:   Left Eye Distance:   Bilateral Distance:    Right Eye Near:   Left Eye Near:    Bilateral Near:     Physical Exam Vitals signs and nursing note reviewed. Exam conducted with a chaperone present.  Constitutional:      General: She is not in acute distress.    Appearance: Normal appearance. She is not ill-appearing, toxic-appearing or diaphoretic.  HENT:     Head: Normocephalic and atraumatic.     Nose: Nose normal.     Mouth/Throat:     Mouth: Mucous membranes are moist.  Eyes:     Conjunctiva/sclera: Conjunctivae normal.     Pupils: Pupils are equal, round, and reactive to light.  Neck:     Musculoskeletal: Normal range of motion and neck supple.  Pulmonary:     Effort: Pulmonary effort is normal.     Breath sounds: Normal breath sounds.  Abdominal:     General: Abdomen is flat. Bowel sounds are normal. There is no distension.     Palpations: Abdomen is soft. There is  no mass.     Tenderness: There is abdominal tenderness. There is no right CVA tenderness, left CVA tenderness, guarding or rebound.     Hernia: No hernia is present.     Comments: Patient has tenderness to palpation along the  left lower quadrant.  There is no guarding there is no rebound present.  Bowel sounds are normal.  There is no distention present.  Musculoskeletal: Normal range of motion.  Skin:    General: Skin is warm.  Neurological:     General: No focal deficit present.     Mental Status: She is alert and oriented to person, place, and time.  Psychiatric:        Mood and Affect: Mood normal.        Behavior: Behavior normal.        Thought Content: Thought content normal.        Judgment: Judgment normal.      UC Treatments / Results  Labs (all labs ordered are listed, but only abnormal results are displayed) Labs Reviewed  CBC WITH DIFFERENTIAL/PLATELET - Abnormal; Notable for the following components:      Result Value   WBC 14.5 (*)    Neutro Abs 11.4 (*)    Monocytes Absolute 1.1 (*)    All other components within normal limits  COMPREHENSIVE METABOLIC PANEL - Abnormal; Notable for the following components:   BUN 28 (*)    All other components within normal limits  URINALYSIS, COMPLETE (UACMP) WITH MICROSCOPIC - Abnormal; Notable for the following components:   Leukocytes,Ua TRACE (*)    All other components within normal limits  NOVEL CORONAVIRUS, NAA (HOSPITAL ORDER, SEND-OUT TO REF LAB)    EKG   Radiology No results found.  Procedures Procedures (including critical care time)  Medications Ordered in UC Medications - No data to display  Initial Impression / Assessment and Plan / UC Course  I have reviewed the triage vital signs and the nursing notes.  Pertinent labs & imaging results that were available during my care of the patient were reviewed by me and considered in my medical decision making (see chart for details).   Reviewed my findings  and as well as the laboratory results with the patient.  She has a mildly elevated white count with a shift.  This could be from her diverticulitis.  Her sinusitis symptoms has not been bothering her since being on the amoxicillin.  Physical exam today does show tenderness in the left lower quadrant consistent with diverticulitis but her abdominal exam is otherwise negative with no guarding, normal bowel sounds and no rebound.  Tenderness is sharply localized to the left lower quadrant.  She has no fever.  Does have fatigue.  I have advised her that we will place her on Augmentin for better coverage of possible diverticulitis.  I have also advised her that if she develops any fever or if the pain worsens or is not improving she should go to the emergency room for possible imaging and further evaluation.  Is agreeable to this.  I further recommended that she follow-up with her primary care physicians at [facility] next week.   Final Clinical Impressions(s) / UC Diagnoses   Final diagnoses:  Colicky LLQ abdominal pain  Diverticulitis of colon     Discharge Instructions     If you develop a fever or increase in your abdominal pain go to the emergency room. Otherwise, recommend following up with your primary care physician at [facility] next week.    ED Prescriptions    Medication Sig Dispense Auth. Provider   amoxicillin-clavulanate (AUGMENTIN) 875-125 MG tablet Take 1 tablet by mouth every 12 (twelve) hours. 14 tablet Lutricia Feiloemer, Charlissa Petros P, PA-C     Controlled Substance Prescriptions   Controlled Substance Registry consulted? Not Applicable   Lorin Picket, PA-C 07/21/19 1302

## 2019-07-21 NOTE — Discharge Instructions (Addendum)
If you develop a fever or increase in your abdominal pain go to the emergency room. Otherwise, recommend following up with your primary care physician at [facility] primary next week.

## 2019-07-23 LAB — NOVEL CORONAVIRUS, NAA (HOSP ORDER, SEND-OUT TO REF LAB; TAT 18-24 HRS): SARS-CoV-2, NAA: NOT DETECTED

## 2019-07-24 ENCOUNTER — Emergency Department
Admission: EM | Admit: 2019-07-24 | Discharge: 2019-07-24 | Disposition: A | Discharge status: Home / Self Care | Payer: Medicare Other | Attending: Emergency Medicine | Admitting: Emergency Medicine

## 2019-07-24 ENCOUNTER — Encounter: Payer: Self-pay | Admitting: Emergency Medicine

## 2019-07-24 ENCOUNTER — Emergency Department: Payer: Medicare Other

## 2019-07-24 ENCOUNTER — Other Ambulatory Visit: Payer: Self-pay

## 2019-07-24 DIAGNOSIS — Z79899 Other long term (current) drug therapy: Secondary | ICD-10-CM | POA: Insufficient documentation

## 2019-07-24 DIAGNOSIS — R1032 Left lower quadrant pain: Secondary | ICD-10-CM | POA: Insufficient documentation

## 2019-07-24 DIAGNOSIS — R109 Unspecified abdominal pain: Secondary | ICD-10-CM

## 2019-07-24 DIAGNOSIS — K5792 Diverticulitis of intestine, part unspecified, without perforation or abscess without bleeding: Secondary | ICD-10-CM | POA: Diagnosis not present

## 2019-07-24 LAB — CBC
HCT: 41.1 % (ref 36.0–46.0)
Hemoglobin: 13.4 g/dL (ref 12.0–15.0)
MCH: 31.2 pg (ref 26.0–34.0)
MCHC: 32.6 g/dL (ref 30.0–36.0)
MCV: 95.8 fL (ref 80.0–100.0)
Platelets: 440 10*3/uL — ABNORMAL HIGH (ref 150–400)
RBC: 4.29 MIL/uL (ref 3.87–5.11)
RDW: 13.4 % (ref 11.5–15.5)
WBC: 11.3 10*3/uL — ABNORMAL HIGH (ref 4.0–10.5)
nRBC: 0 % (ref 0.0–0.2)

## 2019-07-24 LAB — COMPREHENSIVE METABOLIC PANEL
ALT: 29 U/L (ref 0–44)
AST: 30 U/L (ref 15–41)
Albumin: 3.8 g/dL (ref 3.5–5.0)
Alkaline Phosphatase: 78 U/L (ref 38–126)
Anion gap: 13 (ref 5–15)
BUN: 26 mg/dL — ABNORMAL HIGH (ref 8–23)
CO2: 25 mmol/L (ref 22–32)
Calcium: 9.7 mg/dL (ref 8.9–10.3)
Chloride: 103 mmol/L (ref 98–111)
Creatinine, Ser: 0.77 mg/dL (ref 0.44–1.00)
GFR calc Af Amer: >60 mL/min (ref >60)
GFR calc non Af Amer: >60 mL/min (ref >60)
Glucose, Bld: 125 mg/dL — ABNORMAL HIGH (ref 70–99)
Potassium: 4.5 mmol/L (ref 3.5–5.1)
Sodium: 141 mmol/L (ref 135–145)
Total Bilirubin: 0.4 mg/dL (ref 0.3–1.2)
Total Protein: 8 g/dL (ref 6.5–8.1)

## 2019-07-24 LAB — URINALYSIS, COMPLETE (UACMP) WITH MICROSCOPIC
Bacteria, UA: NONE SEEN
Bilirubin Urine: NEGATIVE
Glucose, UA: NEGATIVE mg/dL
Hgb urine dipstick: NEGATIVE
Ketones, ur: NEGATIVE mg/dL
Leukocytes,Ua: NEGATIVE
Nitrite: NEGATIVE
Protein, ur: NEGATIVE mg/dL
Specific Gravity, Urine: 1.009 (ref 1.005–1.030)
pH: 6 (ref 5.0–8.0)

## 2019-07-24 LAB — TSH: TSH: 1.61 u[IU]/mL (ref 0.350–4.500)

## 2019-07-24 LAB — LIPASE, BLOOD: Lipase: 34 U/L (ref 11–51)

## 2019-07-24 MED ORDER — IOHEXOL 300 MG/ML  SOLN
100.0000 mL | Freq: Once | INTRAMUSCULAR | Status: AC | PRN
  Administered 2019-07-24: 11:00:00 100 mL via INTRAVENOUS

## 2019-07-24 MED ORDER — SODIUM CHLORIDE 0.9% FLUSH: 3.0000 mL | Freq: Once | INTRAVENOUS | Status: DC

## 2019-07-24 MED ORDER — IOHEXOL 240 MG/ML SOLN
50.0000 mL | Freq: Once | INTRAMUSCULAR | Status: AC | PRN
  Administered 2019-07-24: 11:00:00 50 mL via ORAL

## 2019-07-24 NOTE — ED Provider Notes (Signed)
[facility] Emergency Department Provider Note   ____________________________________________   First MD Initiated Contact with Patient 07/24/19 [phone]     (approximate)  I have reviewed the triage vital signs and the nursing notes.   HISTORY  Chief Complaint Abdominal Pain   HPI Rebecca Morris is a 73 y.o. female who reports she is having left lower quadrant pain she was first seen on the 23rd for the same thing.  She is placed on Augmentin pain seemed to get better but she still having some pain there and she is very tired and has no energy.  She reports she slept most the day yesterday.  She is not eating much either.  She only had some carnation instant breakfast yesterday evening.  She has been eating much for about a week.  This is mostly because she is trying to avoid making the diverticulitis worse.  Pain she is having is moderate dull ache in the left lower quadrant that is been there since about Sunday.  He is having no nausea vomiting or fever.         Past Medical History:  Diagnosis Date   GERD (gastroesophageal reflux disease)     There are no active problems to display for this patient.   Past Surgical History:  Procedure Laterality Date   ABDOMINAL HYSTERECTOMY     APPENDECTOMY     BREAST CYST ASPIRATION Left 1988   asp cyst   COLONOSCOPY     COLONOSCOPY WITH PROPOFOL N/A 09/14/2017   Procedure: COLONOSCOPY WITH PROPOFOL;  Surgeon: Jonathon Bellows, MD;  Location: [facility] ENDOSCOPY;  Service: Gastroenterology;  Laterality: N/A;    Prior to Admission medications   Medication Sig Start Date End Date Taking? Authorizing Provider  amoxicillin-clavulanate (AUGMENTIN) 875-125 MG tablet Take 1 tablet by mouth every 12 (twelve) hours. 07/21/19   Lorin Picket, PA-C  atorvastatin (LIPITOR) 20 MG tablet Take by mouth. 06/23/17   [provider]  cetirizine (ZYRTEC) 10 MG tablet Take by mouth.    [provider]  [facility]  BUDESONIDE 32 MCG/ACT nasal spray PLACE 1 SPRAY INTO BOTH NOSTRILS ONCE DAILY. 06/30/17   [provider]  dicyclomine (BENTYL) 10 MG capsule TAKE 1 CAPSULE (10 MG TOTAL) BY MOUTH 4 (FOUR) TIMES DAILY - BEFORE MEALS AND AT BEDTIME. 11/15/17 12/15/17  Jonathon Bellows, MD  dicyclomine (BENTYL) 10 MG capsule Take 1 capsule (10 mg total) by mouth at bedtime as needed for spasms. 01/29/18 02/28/18  Jonathon Bellows, MD  dicyclomine (BENTYL) 10 MG capsule TAKE 1 CAPSULE (10 MG TOTAL) BY MOUTH 4 (FOUR) TIMES DAILY - BEFORE MEALS AND AT BEDTIME. 02/12/19 08/11/19  Jonathon Bellows, MD  estradiol (ESTRACE) 1 MG tablet Take 1 mg by mouth daily. 07/20/17   [provider]  FLUoxetine (PROZAC) 20 MG tablet Take 20 mg by mouth daily. 06/09/17   [provider]  Multiple Vitamin (MULTIVITAMIN) tablet Take 1 tablet by mouth daily.    [provider]    Allergies Macrodantin [nitrofurantoin macrocrystal]  Family History  Problem Relation Age of Onset   Breast cancer Mother 58    Social History Social History   Tobacco Use   Smoking status: Never Smoker   Smokeless tobacco: Never Used  Substance Use Topics   Alcohol use: No   Drug use: No    Review of Systems  Constitutional: No fever/chills Eyes: No visual changes. ENT: No sore throat. Cardiovascular: Denies chest pain. Respiratory: Denies shortness of breath.  Gastrointestinal:  abdominal pain.  No nausea, no vomiting.  No diarrhea.  No constipation. Genitourinary: Negative for dysuria. Musculoskeletal: Negative for back pain. Skin: Negative for rash. Neurological: Negative for headaches, focal weakness  ____________________________________________   PHYSICAL EXAM:  VITAL SIGNS: ED Triage Vitals  Enc Vitals Group     BP 07/24/19 0727 (!) 151/68     Pulse Rate 07/24/19 0727 99     Resp 07/24/19 0727 19     Temp 07/24/19 0727 98.2 F (36.8 C)     Temp Source 07/24/19 0727 Oral     SpO2 07/24/19 0727 100 %      Weight 07/24/19 0726 149 lb (67.6 kg)     Height 07/24/19 0726 5\' 4"  (1.626 m)     Head Circumference --      Peak Flow --      Pain Score 07/24/19 0754 1     Pain Loc --      Pain Edu? --      Excl. in GC? --    Constitutional: Alert and oriented. Well appearing and in no acute distress. Eyes: Conjunctivae are normal.  Head: Atraumatic. Nose: No congestion/rhinnorhea. Mouth/Throat: Mucous membranes are moist.  Oropharynx non-erythematous. Neck: No stridor. Cardiovascular: Normal rate, regular rhythm. Grossly normal heart sounds.  Good peripheral circulation. Respiratory: Normal respiratory effort.  No retractions. Lungs CTAB. Gastrointestinal: Soft tender to palpation left lower quadrant no tenderness to percussion. No distention. No abdominal bruits. No CVA tenderness. Musculoskeletal: No lower extremity tenderness nor edema.  Neurologic:  Normal speech and language. No gross focal neurologic deficits are appreciated. Skin:  Skin is warm, dry and intact. No rash noted.   ____________________________________________   LABS (all labs ordered are listed, but only abnormal results are displayed)  Labs Reviewed  COMPREHENSIVE METABOLIC PANEL - Abnormal; Notable for the following components:      Result Value   Glucose, Bld 125 (*)    BUN 26 (*)    All other components within normal limits  CBC - Abnormal; Notable for the following components:   WBC 11.3 (*)    Platelets 440 (*)    All other components within normal limits  URINALYSIS, COMPLETE (UACMP) WITH MICROSCOPIC - Abnormal; Notable for the following components:   Color, Urine STRAW (*)    APPearance CLEAR (*)    All other components within normal limits  LIPASE, BLOOD  TSH   ____________________________________________  EKG  EKG read and interpreted by me shows normal sinus rhythm rate of 93 normal axis no acute ST-T wave changes ____________________________________________  RADIOLOGY  ED MD interpretation:    Official radiology report(s): Ct Abdomen Pelvis W Contrast  Result Date: 07/24/2019 CLINICAL DATA:  Onset weakness and feeling of exhaustion yesterday. Abdominal pain for a few days. The patient is being treated for diverticulitis. EXAM: CT ABDOMEN AND PELVIS WITH CONTRAST TECHNIQUE: Multidetector CT imaging of the abdomen and pelvis was performed using the standard protocol following bolus administration of intravenous contrast. CONTRAST:  100 mL OMNIPAQUE IOHEXOL 300 MG/ML  SOLN COMPARISON:  CT abdomen and pelvis 07/14/2017. FINDINGS: Lower chest: Lung bases are clear. No pleural or pericardial effusion. Heart size is normal. Hepatobiliary: No focal liver abnormality is seen. No gallstones, gallbladder wall thickening, or biliary dilatation. Pancreas: Unremarkable. No pancreatic ductal dilatation or surrounding inflammatory changes. Spleen: Normal in size without focal abnormality. Adrenals/Urinary Tract: Adrenal glands are unremarkable. Kidneys are normal, without renal calculi, focal lesion, or hydronephrosis. Bladder is unremarkable. Stomach/Bowel: The patient has  sigmoid diverticulosis. There is stranding about a diverticulum along the left side of the proximal to mid sigmoid colon consistent with acute diverticulitis. No abscess or perforation. There is a fairly large volume of stool in the rectosigmoid colon. Status post appendectomy. Stomach and small bowel appear normal. Vascular/Lymphatic: Aortic atherosclerosis. No enlarged abdominal or pelvic lymph nodes. Reproductive: Status post hysterectomy. No adnexal masses. Other: Small fat containing umbilical hernia noted. Musculoskeletal: No acute or focal abnormality. Convex right lumbar scoliosis and multilevel thoracic and lumbar degenerative change noted. IMPRESSION: Acute sigmoid diverticulitis without abscess or perforation. Moderately large volume of stool in the rectosigmoid colon noted. Atherosclerosis. Small fat containing umbilical hernia.  Multilevel spondylosis.  Convex right lumbar scoliosis. Electronically Signed   By: Drusilla Kanner M.D.   On: 07/24/2019 11:37    ____________________________________________   PROCEDURES  Procedure(s) performed (including Critical Care):  Procedures   ____________________________________________   INITIAL IMPRESSION / ASSESSMENT AND PLAN / ED COURSE  Patient CT shows some diverticulitis and moderate stool burden in the distal colon.  Patient says she usually has a bowel movement 2 hours after she drinks her [employer] which she did not have today.  She wants to go home.  She looks well.  I will let her go home finish her Augmentin and eat.  She understands the need to eat.              ____________________________________________   FINAL CLINICAL IMPRESSION(S) / ED DIAGNOSES  Final diagnoses:  Abdominal pain, unspecified abdominal location  Diverticulitis     ED Discharge Orders    None       Note:  This document was prepared using Dragon voice recognition software and may include unintentional dictation errors.    Arnaldo Natal, MD 07/24/19 1239

## 2019-07-24 NOTE — ED Notes (Signed)
Pt up to toilet 

## 2019-07-24 NOTE — ED Triage Notes (Signed)
Pt here with c/o left sided rib pain, abd pain for the past few days now. Was seen at [facility] urgent care and treated for diverticulitis on 07/21/19, has been taking Augmentin since Sunday, yesterday and this am began feeling weak and exhausted, denies N/V/D. Last bm yesterday morning but feels a bm would help. Anxious in triage.

## 2019-07-24 NOTE — Discharge Instructions (Addendum)
The CAT scan still shows you still have some diverticulitis.  Please finish the Augmentin.  Take it twice a day with food.  You also have a good bit of stool in the lower colon.  If you usually have a bowel movement after you drink your [employer] instant breakfast go ahead and have some then have a bowel movement.  If that does not work please return.  Make sure you are eating at least 3 small meals a day.  If you cannot eat at all please return.  Otherwise make yourself eat something.  That should help a good deal with your weakness.  Please return here for worse, pain pain continuing after you finish the Augmentin, fever, vomiting or diarrhea or not stooling.

## 2019-07-24 NOTE — ED Notes (Signed)
Patient transported to CT 

## 2019-07-24 NOTE — ED Notes (Signed)
Assumed care of patient. Patrent aox4, IV hep lock placed to lt ac prior to leaving unit for CT. Urine specimen obtained and sent. S/O at bedside. Will monitor upon return.

## 2019-08-04 ENCOUNTER — Other Ambulatory Visit: Payer: Self-pay | Admitting: Gastroenterology

## 2019-08-04 DIAGNOSIS — K5792 Diverticulitis of intestine, part unspecified, without perforation or abscess without bleeding: Secondary | ICD-10-CM

## 2019-08-27 ENCOUNTER — Other Ambulatory Visit: Payer: Self-pay

## 2019-08-27 ENCOUNTER — Encounter (INDEPENDENT_AMBULATORY_CARE_PROVIDER_SITE_OTHER): Payer: Self-pay

## 2019-08-27 ENCOUNTER — Ambulatory Visit (INDEPENDENT_AMBULATORY_CARE_PROVIDER_SITE_OTHER): Payer: Medicare Other | Admitting: Gastroenterology

## 2019-08-27 ENCOUNTER — Encounter: Payer: Self-pay | Admitting: Gastroenterology

## 2019-08-27 VITALS — BP 122/70 | HR 90 | Temp 98.2°F | Wt 150.0 lb

## 2019-08-27 DIAGNOSIS — K5900 Constipation, unspecified: Secondary | ICD-10-CM

## 2019-08-27 DIAGNOSIS — K5792 Diverticulitis of intestine, part unspecified, without perforation or abscess without bleeding: Secondary | ICD-10-CM

## 2019-08-27 NOTE — Progress Notes (Signed)
Rebecca Mood MD, MRCP(U.K) [address]  [address]  [place], [place] [address]  Main: [phone]  Fax: [phone]   Primary Care Physician: Rebecca Morris, CNM  Primary Gastroenterologist:  Dr. Wyline Morris   No chief complaint on file.   HPI: Rebecca Morris is a 73 y.o. female was initially seen on 07/24/2017 after she had an episode of acute diverticulitis of the rectosigmoid junction.  Plan was to perform a colonoscopy in 8 weeks.  Grossly appeared normal except for diverticulosis of the colon but the prep was poor and was recommended a repeat colonoscopy for screening to rule out small polyps and flat lesions.  He subsequently rescheduled and did not have been done.  He presented to the emergency room on 07/21/2019 as well as on 07/24/2019 with left lower quadrant pain.  Had developed acute sigmoid diverticulitis without abscess or perforation.  Moderately large volume of stool in the rectosigmoid colon also noted.  This was noted on a CT scan of the abdomen.  Treated with Augmentin.  Since treatment her pain is resolved.  Denies any other complaints.  Current Outpatient Medications  Medication Sig Dispense Refill  . amoxicillin-clavulanate (AUGMENTIN) 875-125 MG tablet Take 1 tablet by mouth every 12 (twelve) hours. 14 tablet 0  . atorvastatin (LIPITOR) 20 MG tablet Take by mouth.    . cetirizine (ZYRTEC) 10 MG tablet Take by mouth.    . [facility] BUDESONIDE 32 MCG/ACT nasal spray PLACE 1 SPRAY INTO BOTH NOSTRILS ONCE DAILY.  11  . dicyclomine (BENTYL) 10 MG capsule TAKE 1 CAPSULE (10 MG TOTAL) BY MOUTH 4 (FOUR) TIMES DAILY - BEFORE MEALS AND AT BEDTIME. 120 capsule 0  . dicyclomine (BENTYL) 10 MG capsule Take 1 capsule (10 mg total) by mouth at bedtime as needed for spasms. 120 capsule 3  . dicyclomine (BENTYL) 10 MG capsule TAKE 1 CAPSULE (10 MG TOTAL) BY MOUTH 4 (FOUR) TIMES DAILY - BEFORE MEALS AND AT BEDTIME. 120 capsule 5  . estradiol (ESTRACE) 1 MG tablet Take 1 mg by mouth  daily.  1  . FLUoxetine (PROZAC) 20 MG tablet Take 20 mg by mouth daily.  1  . Multiple Vitamin (MULTIVITAMIN) tablet Take 1 tablet by mouth daily.     No current facility-administered medications for this visit.     Allergies as of 08/27/2019 - Review Complete 07/24/2019  Allergen Reaction Noted  . Macrodantin [nitrofurantoin macrocrystal] Itching 07/14/2017    ROS:  General: Negative for anorexia, weight loss, fever, chills, fatigue, weakness. ENT: Negative for hoarseness, difficulty swallowing , nasal congestion. CV: Negative for chest pain, angina, palpitations, dyspnea on exertion, peripheral edema.  Respiratory: Negative for dyspnea at rest, dyspnea on exertion, cough, sputum, wheezing.  GI: See history of present illness. GU:  Negative for dysuria, hematuria, urinary incontinence, urinary frequency, nocturnal urination.  Endo: Negative for unusual weight change.    Physical Examination:   There were no vitals taken for this visit.  General: Well-nourished, well-developed in no acute distress.  Eyes: No icterus. Conjunctivae pink. Mouth: Oropharyngeal mucosa moist and pink , no lesions erythema or exudate. Lungs: Clear to auscultation bilaterally. Non-labored. Heart: Regular rate and rhythm, no murmurs rubs or gallops.  Abdomen: Bowel sounds are normal, nontender, nondistended, no hepatosplenomegaly or masses, no abdominal bruits or hernia , no rebound or guarding.   Extremities: No lower extremity edema. No clubbing or deformities. Neuro: Alert and oriented x 3.  Grossly intact. Skin: Warm and dry, no jaundice.  Psych: Alert and cooperative, normal Morris and affect.   Imaging Studies: No results found.  Assessment and Plan:   Rebecca Morris is a 73 y.o. y/o female recently seen by office back in 2018 for a colonoscopy after an episode of rectosigmoid diverticulitis.  Subsequent colonoscopy did not show any large lesions but the prep was poor and hence small polyps or  flat lesions could have been missed.  Was recommended repeat colonoscopy but did not reschedule.  Presented to the emergency room on 07/21/2019 with left lower quadrant pain and on CT scan was found to have acute sigmoid diverticulitis.  Moderately large volume of stool in the rectosigmoid colon also noted.  This is her second episode of diverticulitis.  She has bonded well to her antibiotics and clinically has no tenderness of her abdomen.  We discussed briefly about surgery in case this would be a recurring issue but I do not think that it warrants referral at this point of time.  Plan 1.  Suggest to repeat colonoscopy as planned previously in 4 to 6 weeks time 2.  Commence on daily MiraLAX for constipation. 3.  High-fiber diet.  I have discussed alternative options, risks & benefits,  which include, but are not limited to, bleeding, infection, perforation,respiratory complication & drug reaction.  The patient agrees with this plan & written consent will be obtained.      Dr Rebecca Bellows  MD,MRCP [facility]) Follow up in as needed

## 2019-09-03 ENCOUNTER — Ambulatory Visit: Payer: Medicare Other | Admitting: Gastroenterology

## 2019-09-16 ENCOUNTER — Telehealth: Payer: Self-pay | Admitting: Gastroenterology

## 2019-09-16 NOTE — Telephone Encounter (Signed)
Patient called in  & l/m on v/m to request something for a UTI to be called into [facility] before her colonoscopy on 09-23-19. Please call if any questions [phone].

## 2019-09-16 NOTE — Telephone Encounter (Signed)
Spoke with pt and informed her Dr. Vicente Males stated he is not comfortable writing rx's for UTI's as that is not his specialty. She will need to contact her PCP and discuss. She stated she has been taking OTC AZO for 3 days and its not helping. I advised her that is only a temporary pain relief. She will need antibiotics if it is a UTI.

## 2019-09-19 ENCOUNTER — Other Ambulatory Visit
Admission: RE | Admit: 2019-09-19 | Discharge: 2019-09-19 | Disposition: A | Discharge status: Home / Self Care | Payer: Medicare Other | Source: Ambulatory Visit | Attending: Gastroenterology | Admitting: Gastroenterology

## 2019-09-19 ENCOUNTER — Other Ambulatory Visit: Payer: Self-pay

## 2019-09-19 DIAGNOSIS — Z01812 Encounter for preprocedural laboratory examination: Secondary | ICD-10-CM | POA: Diagnosis present

## 2019-09-19 DIAGNOSIS — Z20828 Contact with and (suspected) exposure to other viral communicable diseases: Secondary | ICD-10-CM | POA: Insufficient documentation

## 2019-09-19 LAB — SARS CORONAVIRUS 2 (TAT 6-24 HRS): SARS Coronavirus 2: NEGATIVE

## 2019-09-23 ENCOUNTER — Telehealth: Payer: Self-pay | Admitting: Gastroenterology

## 2019-09-23 ENCOUNTER — Ambulatory Visit: Payer: Medicare Other | Admitting: Anesthesiology

## 2019-09-23 ENCOUNTER — Other Ambulatory Visit: Payer: Self-pay

## 2019-09-23 ENCOUNTER — Ambulatory Visit
Admission: RE | Admit: 2019-09-23 | Discharge: 2019-09-23 | Disposition: A | Discharge status: Home / Self Care | Payer: Medicare Other | Attending: Gastroenterology | Admitting: Gastroenterology

## 2019-09-23 ENCOUNTER — Encounter
Admission: RE | Disposition: A | Discharge status: Home / Self Care | Payer: Self-pay | Source: Home / Self Care | Attending: Gastroenterology

## 2019-09-23 DIAGNOSIS — Z79899 Other long term (current) drug therapy: Secondary | ICD-10-CM | POA: Insufficient documentation

## 2019-09-23 DIAGNOSIS — Z8719 Personal history of other diseases of the digestive system: Secondary | ICD-10-CM | POA: Insufficient documentation

## 2019-09-23 DIAGNOSIS — Z09 Encounter for follow-up examination after completed treatment for conditions other than malignant neoplasm: Secondary | ICD-10-CM | POA: Insufficient documentation

## 2019-09-23 DIAGNOSIS — K5792 Diverticulitis of intestine, part unspecified, without perforation or abscess without bleeding: Secondary | ICD-10-CM

## 2019-09-23 DIAGNOSIS — K5900 Constipation, unspecified: Secondary | ICD-10-CM

## 2019-09-23 DIAGNOSIS — Z86010 Personal history of colonic polyps: Secondary | ICD-10-CM | POA: Diagnosis not present

## 2019-09-23 DIAGNOSIS — Z79890 Hormone replacement therapy (postmenopausal): Secondary | ICD-10-CM | POA: Diagnosis not present

## 2019-09-23 HISTORY — PX: COLONOSCOPY WITH PROPOFOL: SHX5780

## 2019-09-23 SURGERY — COLONOSCOPY WITH PROPOFOL
Anesthesia: General

## 2019-09-23 MED ORDER — PROPOFOL 500 MG/50ML IV EMUL
INTRAVENOUS | Status: DC | PRN
  Administered 2019-09-23: 110 ug/kg/min via INTRAVENOUS

## 2019-09-23 MED ORDER — SODIUM CHLORIDE 0.9 % IV SOLN
INTRAVENOUS | Status: DC
  Administered 2019-09-23: 08:00:00 via INTRAVENOUS

## 2019-09-23 MED ORDER — LIDOCAINE HCL (CARDIAC) PF 100 MG/5ML IV SOSY
PREFILLED_SYRINGE | INTRAVENOUS | Status: DC | PRN
  Administered 2019-09-23: 50 mg via INTRAVENOUS

## 2019-09-23 MED ORDER — PROPOFOL 10 MG/ML IV BOLUS
INTRAVENOUS | Status: DC | PRN
  Administered 2019-09-23: 90 mg via INTRAVENOUS

## 2019-09-23 MED ORDER — PROPOFOL 500 MG/50ML IV EMUL
INTRAVENOUS | Status: AC
  Filled 2019-09-23: qty 50

## 2019-09-23 MED ORDER — LIDOCAINE HCL (PF) 2 % IJ SOLN
INTRAMUSCULAR | Status: AC
  Filled 2019-09-23: qty 10

## 2019-09-23 NOTE — Anesthesia Post-op Follow-up Note (Signed)
Anesthesia QCDR form completed.        

## 2019-09-23 NOTE — Anesthesia Postprocedure Evaluation (Signed)
Anesthesia Post Note  Patient: Rebecca Morris  Procedure(s) Performed: COLONOSCOPY WITH PROPOFOL (N/A )  Patient location during evaluation: Endoscopy Anesthesia Type: General Level of consciousness: awake and alert Pain management: pain level controlled Vital Signs Assessment: post-procedure vital signs reviewed and stable Respiratory status: spontaneous breathing and respiratory function stable Cardiovascular status: stable Anesthetic complications: no     Last Vitals:  Vitals:   09/23/19 0820 09/23/19 0830  BP: (!) 121/57 (!) 123/58  Pulse: 67 61  Resp: (!) 22 18  Temp: (!) 36.1 C   SpO2: 93% 96%    Last Pain:  Vitals:   09/23/19 0830  TempSrc:   PainSc: 0-No pain                 Branko Steeves K

## 2019-09-23 NOTE — Telephone Encounter (Signed)
Pt husband left vm with some concerns about pts pre op rx he states it is taking an emotional toll on her  Please call him  He has some questions [phone]

## 2019-09-23 NOTE — Transfer of Care (Signed)
Immediate Anesthesia Transfer of Care Note  Patient: Rebecca Morris  Procedure(s) Performed: COLONOSCOPY WITH PROPOFOL (N/A )  Patient Location: Endoscopy Unit  Anesthesia Type:General  Level of Consciousness: drowsy and patient cooperative  Airway & Oxygen Therapy: Patient Spontanous Breathing and Patient connected to nasal cannula oxygen  Post-op Assessment: Report given to RN and Post -op Vital signs reviewed and stable  Post vital signs: Reviewed and stable  Last Vitals:  Vitals Value Taken Time  BP 121/57 09/23/19 0820  Temp 36.1 C 09/23/19 0820  Pulse 63 09/23/19 0822  Resp 19 09/23/19 0822  SpO2 96 % 09/23/19 0822  Vitals shown include unvalidated device data.  Last Pain:  Vitals:   09/23/19 0820  TempSrc: Tympanic  PainSc: 0-No pain         Complications: No apparent anesthesia complications

## 2019-09-23 NOTE — H&P (Signed)
[name], MD [address], [place], [place], [address] [address], [place], [place], [address] Phone: [phone]  Fax: [phone]  Primary Care Physician:  Sharee Pimple, CNM   Pre-Procedure History & Physical: HPI:  Rebecca Morris is a 73 y.o. female is here for an colonoscopy.   Past Medical History:  Diagnosis Date  . GERD (gastroesophageal reflux disease)     Past Surgical History:  Procedure Laterality Date  . ABDOMINAL HYSTERECTOMY    . APPENDECTOMY    . BREAST CYST ASPIRATION Left 1988   asp cyst  . COLONOSCOPY    . COLONOSCOPY WITH PROPOFOL N/A 09/14/2017   Procedure: COLONOSCOPY WITH PROPOFOL;  Surgeon: [name], MD;  Location: [facility] ENDOSCOPY;  Service: Gastroenterology;  Laterality: N/A;    Prior to Admission medications   Medication Sig Start Date End Date Taking? Authorizing Provider  atorvastatin (LIPITOR) 20 MG tablet Take by mouth. 06/23/17  Yes [provider]  cetirizine (ZYRTEC) 10 MG tablet Take by mouth.   Yes [provider]  dicyclomine (BENTYL) 10 MG capsule TAKE 1 CAPSULE (10 MG TOTAL) BY MOUTH 4 (FOUR) TIMES DAILY - BEFORE MEALS AND AT BEDTIME. 08/12/19 02/08/20 Yes [name], MD  estradiol (ESTRACE) 1 MG tablet Take 1 mg by mouth daily. 07/20/17  Yes [provider]  FLUoxetine (PROZAC) 20 MG tablet Take 20 mg by mouth daily. 06/09/17  Yes [provider]  Multiple Vitamin (MULTIVITAMIN) tablet Take 1 tablet by mouth daily.   Yes [provider]    Allergies as of 08/27/2019 - Review Complete 08/27/2019  Allergen Reaction Noted  . Macrodantin [nitrofurantoin macrocrystal] Itching 07/14/2017    Family History  Problem Relation Age of Onset  . Breast cancer Mother 25    Social History   Socioeconomic History  . Marital status: Married    Spouse name: Not on file  . Number of children: Not on file  . Years of education: Not on file  . Highest education level: Not  on file  Occupational History  . Not on file  Social Needs  . Financial resource strain: Not on file  . Food insecurity    Worry: Not on file    Inability: Not on file  . Transportation needs    Medical: Not on file    Non-medical: Not on file  Tobacco Use  . Smoking status: Never Smoker  . Smokeless tobacco: Never Used  Substance and Sexual Activity  . Alcohol use: No  . Drug use: No  . Sexual activity: Yes    Birth control/protection: Post-menopausal  Lifestyle  . Physical activity    Days per week: Not on file    Minutes per session: Not on file  . Stress: Not on file  Relationships  . Social Musician on phone: Not on file    Gets together: Not on file    Attends religious service: Not on file    Active member of club or organization: Not on file    Attends meetings of clubs or organizations: Not on file    Relationship status: Not on file  . Intimate partner violence    Fear of current or ex partner: Not on file    Emotionally abused: Not on file    Physically abused: Not on file    Forced sexual activity: Not on file  Other Topics Concern  . Not on file  Social History Narrative  . Not  on file    Review of Systems: See HPI, otherwise negative ROS  Physical Exam: BP (!) 153/65   Pulse 76   Temp 97.7 F (36.5 C) (Oral)   Resp 18   Ht 5\' 4"  (1.626 m)   Wt 64.4 kg   SpO2 95%   BMI 24.37 kg/m  General:   Alert,  pleasant and cooperative in NAD Head:  Normocephalic and atraumatic. Neck:  Supple; no masses or thyromegaly. Lungs:  Clear throughout to auscultation, normal respiratory effort.    Heart:  +S1, +S2, Regular rate and rhythm, No edema. Abdomen:  Soft, nontender and nondistended. Normal bowel sounds, without guarding, and without rebound.   Neurologic:  Alert and  oriented x4;  grossly normal neurologically.  Impression/Plan: Rebecca Morris is here for an colonoscopy to be performed for surveillance due to prior history of colon  polyps   Risks, benefits, limitations, and alternatives regarding  colonoscopy have been reviewed with the patient.  Questions have been answered.  All parties agreeable.   Jonathon Bellows, MD  09/23/2019, 8:09 AM

## 2019-09-23 NOTE — Progress Notes (Signed)
Aborted due to polr prep

## 2019-09-23 NOTE — Op Note (Signed)
[facility] Gastroenterology Patient Name: Rebecca Morris Procedure Date: 09/23/2019 7:27 AM MRN: [id number] Account #: [id number] Date of Birth: Aug 09, 1946 Admit Type: Outpatient Age: 73 Room: [facility] ENDO ROOM 4 Gender: Female Note Status: Finalized Procedure:            Colonoscopy Indications:          High risk colon cancer surveillance: Personal history                        of colonic polyps Providers:            Jonathon Bellows MD, MD Referring MD:         Ane Payment, MD (Referring MD) Medicines:            Monitored Anesthesia Care Complications:        No immediate complications. Procedure:            Pre-Anesthesia Assessment:                       - Prior to the procedure, a History and Physical was                        performed, and patient medications, allergies and                        sensitivities were reviewed. The patient's tolerance of                        previous anesthesia was reviewed.                       - The risks and benefits of the procedure and the                        sedation options and risks were discussed with the                        patient. All questions were answered and informed                        consent was obtained.                       - ASA Grade Assessment: II - A patient with mild                        systemic disease.                       After obtaining informed consent, the colonoscope was                        passed under direct vision. Throughout the procedure,                        the patient's blood pressure, pulse, and oxygen                        saturations were monitored continuously. The  Colonoscope was introduced through the anus with the                        intention of advancing to the cecum. The scope was                        advanced to the sigmoid colon before the procedure was                        aborted. Medications were given. The colonoscopy  was                        performed with ease. The patient tolerated the                        procedure well. The quality of the bowel preparation                        was poor. Findings:      The perianal and digital rectal examinations were normal.      A large amount of stool was found in the recto-sigmoid colon,       interfering with visualization. Impression:           - Preparation of the colon was poor.                       - Stool in the recto-sigmoid colon.                       - No specimens collected. Recommendation:       - Discharge patient to home (with escort).                       - Resume previous diet.                       - Continue present medications.                       - Repeat colonoscopy in 2 weeks because the bowel                        preparation was suboptimal. Procedure Code(s):    --- Professional ---                       [id number], 53, Colonoscopy, flexible; diagnostic, including                        collection of specimen(s) by brushing or washing, when                        performed (separate procedure) Diagnosis Code(s):    --- Professional ---                       Z86.010, Personal history of colonic polyps CPT copyright 2019 American Medical Association. All rights reserved. The codes documented in this report are preliminary and upon coder review may  be revised to meet current compliance requirements. [name], MD [name] MD, MD 09/23/2019 8:16:50 AM This report has been signed electronically. Number of  Addenda: 0 Note Initiated On: 09/23/2019 7:27 AM Total Procedure Duration: 0 hours 1 minute 13 seconds  Estimated Blood Loss: Estimated blood loss: none.      [facility]

## 2019-09-23 NOTE — Anesthesia Preprocedure Evaluation (Signed)
Anesthesia Evaluation  Patient identified by MRN, date of birth, ID band Patient awake    Reviewed: Allergy & Precautions, NPO status , Patient's Chart, lab work & pertinent test results  History of Anesthesia Complications Negative for: history of anesthetic complications  Airway Mallampati: II       Dental   Pulmonary neg sleep apnea, neg COPD, Not current smoker,           Cardiovascular (-) hypertension(-) Past MI and (-) CHF (-) dysrhythmias (-) Valvular Problems/Murmurs     Neuro/Psych neg Seizures    GI/Hepatic Neg liver ROS, neg GERD  ,  Endo/Other  neg diabetes  Renal/GU negative Renal ROS     Musculoskeletal   Abdominal   Peds  Hematology   Anesthesia Other Findings   Reproductive/Obstetrics                            Anesthesia Physical Anesthesia Plan  ASA: II  Anesthesia Plan: General   Post-op Pain Management:    Induction: Intravenous  PONV Risk Score and Plan: 3 and Propofol infusion, TIVA and Treatment may vary due to age or medical condition  Airway Management Planned: Nasal Cannula  Additional Equipment:   Intra-op Plan:   Post-operative Plan:   Informed Consent: I have reviewed the patients History and Physical, chart, labs and discussed the procedure including the risks, benefits and alternatives for the proposed anesthesia with the patient or authorized representative who has indicated his/her understanding and acceptance.       Plan Discussed with:   Anesthesia Plan Comments:         Anesthesia Quick Evaluation

## 2019-09-24 ENCOUNTER — Encounter: Payer: Self-pay | Admitting: Gastroenterology

## 2019-09-24 NOTE — Telephone Encounter (Signed)
Spoke with pt husband regarding his concerns. He states pt has to have her repeat colonoscopy due to poor prep but he's concerned that pt won't drink the bowel prep again. He states pt only had half of the bowel prep because she didn't wake up in time to drink the second half, which is why she was not cleaned out properly but he plans to help her the next time. He states pt is also going through emotional trauma due to lossed loved ones. He states pt will contact our office when she's ready to schedule.

## 2019-09-30 ENCOUNTER — Telehealth: Payer: Self-pay | Admitting: Gastroenterology

## 2019-09-30 NOTE — Telephone Encounter (Signed)
Try bentyl, possible the coinstipation causing cramping , suggest also daily miralax

## 2019-09-30 NOTE — Telephone Encounter (Signed)
Pt left vm she states she had an unsuccessful colonoscopy last Monday and needs to schedule another she is experiencing lower cramping and would like to get something called in for it stronger then Tylenol please call pt

## 2019-10-01 ENCOUNTER — Other Ambulatory Visit: Payer: Self-pay

## 2019-10-01 NOTE — Telephone Encounter (Signed)
If Pain is no better , should see her in the office on Thursday morning after 10 am . Check if she is doing ok to wait till then

## 2019-10-02 ENCOUNTER — Other Ambulatory Visit: Payer: Self-pay

## 2019-10-02 ENCOUNTER — Emergency Department
Admission: EM | Admit: 2019-10-02 | Discharge: 2019-10-02 | Disposition: A | Discharge status: Home / Self Care | Payer: Medicare Other | Attending: Emergency Medicine | Admitting: Emergency Medicine

## 2019-10-02 ENCOUNTER — Emergency Department: Payer: Medicare Other

## 2019-10-02 DIAGNOSIS — K5732 Diverticulitis of large intestine without perforation or abscess without bleeding: Secondary | ICD-10-CM | POA: Insufficient documentation

## 2019-10-02 DIAGNOSIS — R1032 Left lower quadrant pain: Secondary | ICD-10-CM | POA: Diagnosis present

## 2019-10-02 DIAGNOSIS — K5792 Diverticulitis of intestine, part unspecified, without perforation or abscess without bleeding: Secondary | ICD-10-CM

## 2019-10-02 LAB — URINALYSIS, COMPLETE (UACMP) WITH MICROSCOPIC
Bacteria, UA: NONE SEEN
Bilirubin Urine: NEGATIVE
Glucose, UA: NEGATIVE mg/dL
Hgb urine dipstick: NEGATIVE
Ketones, ur: NEGATIVE mg/dL
Leukocytes,Ua: NEGATIVE
Nitrite: NEGATIVE
Protein, ur: NEGATIVE mg/dL
Specific Gravity, Urine: 1.034 — ABNORMAL HIGH (ref 1.005–1.030)
pH: 5 (ref 5.0–8.0)

## 2019-10-02 LAB — COMPREHENSIVE METABOLIC PANEL
ALT: 24 U/L (ref 0–44)
AST: 34 U/L (ref 15–41)
Albumin: 4.1 g/dL (ref 3.5–5.0)
Alkaline Phosphatase: 50 U/L (ref 38–126)
Anion gap: 12 (ref 5–15)
BUN: 20 mg/dL (ref 8–23)
CO2: 25 mmol/L (ref 22–32)
Calcium: 9.4 mg/dL (ref 8.9–10.3)
Chloride: 103 mmol/L (ref 98–111)
Creatinine, Ser: 0.8 mg/dL (ref 0.44–1.00)
GFR calc Af Amer: >60 mL/min (ref >60)
GFR calc non Af Amer: >60 mL/min (ref >60)
Glucose, Bld: 88 mg/dL (ref 70–99)
Potassium: 4.2 mmol/L (ref 3.5–5.1)
Sodium: 140 mmol/L (ref 135–145)
Total Bilirubin: 0.5 mg/dL (ref 0.3–1.2)
Total Protein: 8.1 g/dL (ref 6.5–8.1)

## 2019-10-02 LAB — CBC WITH DIFFERENTIAL/PLATELET
Abs Immature Granulocytes: 0.02 10*3/uL (ref 0.00–0.07)
Basophils Absolute: 0.1 10*3/uL (ref 0.0–0.1)
Basophils Relative: 1 %
Eosinophils Absolute: 0.1 10*3/uL (ref 0.0–0.5)
Eosinophils Relative: 2 %
HCT: 42.5 % (ref 36.0–46.0)
Hemoglobin: 13.6 g/dL (ref 12.0–15.0)
Immature Granulocytes: 0 %
Lymphocytes Relative: 20 %
Lymphs Abs: 1.7 10*3/uL (ref 0.7–4.0)
MCH: 31.1 pg (ref 26.0–34.0)
MCHC: 32 g/dL (ref 30.0–36.0)
MCV: 97 fL (ref 80.0–100.0)
Monocytes Absolute: 0.6 10*3/uL (ref 0.1–1.0)
Monocytes Relative: 7 %
Neutro Abs: 5.9 10*3/uL (ref 1.7–7.7)
Neutrophils Relative %: 70 %
Platelets: 439 10*3/uL — ABNORMAL HIGH (ref 150–400)
RBC: 4.38 MIL/uL (ref 3.87–5.11)
RDW: 14.8 % (ref 11.5–15.5)
WBC: 8.4 10*3/uL (ref 4.0–10.5)
nRBC: 0 % (ref 0.0–0.2)

## 2019-10-02 LAB — TROPONIN I (HIGH SENSITIVITY): Troponin I (High Sensitivity): 5 ng/L (ref <18)

## 2019-10-02 LAB — LIPASE, BLOOD: Lipase: 30 U/L (ref 11–51)

## 2019-10-02 MED ORDER — MORPHINE SULFATE (PF) 4 MG/ML IV SOLN
4.0000 mg | Freq: Once | INTRAVENOUS | Status: AC
  Administered 2019-10-02: 08:00:00 4 mg via INTRAVENOUS
  Filled 2019-10-02: qty 1

## 2019-10-02 MED ORDER — CIPROFLOXACIN HCL 500 MG PO TABS
500.0000 mg | ORAL_TABLET | Freq: Once | ORAL | Status: AC
  Administered 2019-10-02: 11:00:00 500 mg via ORAL
  Filled 2019-10-02: qty 1

## 2019-10-02 MED ORDER — DOCUSATE SODIUM 100 MG PO CAPS
100.0000 mg | ORAL_CAPSULE | Freq: Once | ORAL | Status: DC
  Filled 2019-10-02: qty 1

## 2019-10-02 MED ORDER — OXYCODONE-ACETAMINOPHEN 5-325 MG PO TABS
2.0000 | ORAL_TABLET | Freq: Once | ORAL | Status: AC
  Administered 2019-10-02: 2 via ORAL
  Filled 2019-10-02: qty 2

## 2019-10-02 MED ORDER — METRONIDAZOLE 500 MG PO TABS: 500.0000 mg | ORAL_TABLET | Freq: Three times a day (TID) | ORAL | 0 refills | Status: DC

## 2019-10-02 MED ORDER — IOHEXOL 300 MG/ML  SOLN
100.0000 mL | Freq: Once | INTRAMUSCULAR | Status: AC | PRN
  Administered 2019-10-02: 10:00:00 100 mL via INTRAVENOUS

## 2019-10-02 MED ORDER — ONDANSETRON HCL 4 MG/2ML IJ SOLN
4.0000 mg | Freq: Once | INTRAMUSCULAR | Status: AC
  Administered 2019-10-02: 08:00:00 4 mg via INTRAVENOUS
  Filled 2019-10-02: qty 2

## 2019-10-02 MED ORDER — CIPROFLOXACIN HCL 500 MG PO TABS: 500.0000 mg | ORAL_TABLET | Freq: Two times a day (BID) | ORAL | 0 refills | Status: DC

## 2019-10-02 MED ORDER — OXYCODONE-ACETAMINOPHEN 5-325 MG PO TABS: 1.0000 | ORAL_TABLET | Freq: Two times a day (BID) | ORAL | 0 refills | Status: DC | PRN

## 2019-10-02 MED ORDER — METRONIDAZOLE 500 MG PO TABS
500.0000 mg | ORAL_TABLET | Freq: Once | ORAL | Status: AC
  Administered 2019-10-02: 500 mg via ORAL
  Filled 2019-10-02: qty 1

## 2019-10-02 NOTE — Telephone Encounter (Signed)
Called pt to inform her of Dr. Georgeann Oppenheim suggestion.  Unable to contact, LVM to return call

## 2019-10-02 NOTE — ED Provider Notes (Signed)
[facility] Emergency Department Provider Note       Time seen: ----------------------------------------- 8:03 AM on 10/02/2019 -----------------------------------------  I have reviewed the triage vital signs and the nursing notes.  HISTORY   Chief Complaint Abdominal Pain   HPI Rebecca Morris is a 73 y.o. female with a history of GERD, appendectomy, diverticulosis who presents to the ED for left lower quadrant pain.  Patient reports a history of diverticulitis but upon questioning she is referring to diverticulosis.  She has never had diverticulitis.  Is having left lower quadrant pain since Sunday, denies nausea, vomiting or diarrhea.  She had a normal bowel movement this morning.  Past Medical History:  Diagnosis Date  . GERD (gastroesophageal reflux disease)     There are no active problems to display for this patient.   Past Surgical History:  Procedure Laterality Date  . ABDOMINAL HYSTERECTOMY    . APPENDECTOMY    . BREAST CYST ASPIRATION Left 1988   asp cyst  . COLONOSCOPY    . COLONOSCOPY WITH PROPOFOL N/A 09/14/2017   Procedure: COLONOSCOPY WITH PROPOFOL;  Surgeon: Jonathon Bellows, MD;  Location: [facility] ENDOSCOPY;  Service: Gastroenterology;  Laterality: N/A;  . COLONOSCOPY WITH PROPOFOL N/A 09/23/2019   Procedure: COLONOSCOPY WITH PROPOFOL;  Surgeon: Jonathon Bellows, MD;  Location: [facility] ENDOSCOPY;  Service: Gastroenterology;  Laterality: N/A;    Allergies Macrodantin [nitrofurantoin macrocrystal]  Social History Social History   Tobacco Use  . Smoking status: Never Smoker  . Smokeless tobacco: Never Used  Substance Use Topics  . Alcohol use: No  . Drug use: No   Review of Systems Constitutional: Negative for fever. Cardiovascular: Negative for chest pain. Respiratory: Negative for shortness of breath. Gastrointestinal: Positive for abdominal pain Musculoskeletal: Negative for back pain. Skin: Negative for rash. Neurological: Negative  for headaches, focal weakness or numbness.  All systems negative/normal/unremarkable except as stated in the HPI  ____________________________________________   PHYSICAL EXAM:  VITAL SIGNS: ED Triage Vitals  Enc Vitals Group     BP 10/02/19 0741 (!) 159/59     Pulse Rate 10/02/19 0741 93     Resp 10/02/19 0741 18     Temp 10/02/19 0743 97.9 F (36.6 C)     Temp Source 10/02/19 0741 Oral     SpO2 10/02/19 0741 96 %     Weight 10/02/19 0742 142 lb (64.4 kg)     Height 10/02/19 0742 5\' 4"  (1.626 m)     Head Circumference --      Peak Flow --      Pain Score 10/02/19 0742 5     Pain Loc --      Pain Edu? --      Excl. in [place]? --    Constitutional: Alert and oriented. Well appearing and in no distress. Eyes: Conjunctivae are normal. Normal extraocular movements. Cardiovascular: Normal rate, regular rhythm. No murmurs, rubs, or gallops. Respiratory: Normal respiratory effort without tachypnea nor retractions. Breath sounds are clear and equal bilaterally. No wheezes/rales/rhonchi. Gastrointestinal: Soft and nontender. Normal bowel sounds Musculoskeletal: Nontender with normal range of motion in extremities. No lower extremity tenderness nor edema. Neurologic:  Normal speech and language. No gross focal neurologic deficits are appreciated.  Skin:  Skin is warm, dry and intact. No rash noted. Psychiatric: Mood and affect are normal. Speech and behavior are normal.  ____________________________________________  ED COURSE:  As part of my medical decision making, I reviewed the following data within the [place] History  obtained from family if available, nursing notes, old chart and ekg, as well as notes from prior ED visits. Patient presented for left lower quadrant pain, we will assess with labs and imaging as indicated at this time.   Procedures  Rebecca Morris was evaluated in Emergency Department on 10/02/2019 for the symptoms described in the history of  present illness. She was evaluated in the context of the global COVID-19 pandemic, which necessitated consideration that the patient might be at risk for infection with the SARS-CoV-2 virus that causes COVID-19. Institutional protocols and algorithms that pertain to the evaluation of patients at risk for COVID-19 are in a state of rapid change based on information released by regulatory bodies including the CDC and federal and state organizations. These policies and algorithms were followed during the patient's care in the ED.  ____________________________________________   LABS (pertinent positives/negatives)  Labs Reviewed  CBC WITH DIFFERENTIAL/PLATELET - Abnormal; Notable for the following components:      Result Value   Platelets 439 (*)    All other components within normal limits  URINALYSIS, COMPLETE (UACMP) WITH MICROSCOPIC - Abnormal; Notable for the following components:   Color, Urine AMBER (*)    APPearance HAZY (*)    Specific Gravity, Urine 1.034 (*)    All other components within normal limits  COMPREHENSIVE METABOLIC PANEL  LIPASE, BLOOD  TROPONIN I (HIGH SENSITIVITY)    RADIOLOGY Images were viewed by me  CT the abdomen pelvis with contrast  IMPRESSION:  1. Mild uncomplicated diverticulitis, findings are less severe when  compared to the previous exam of August of 2020. Follow-up imaging  may be helpful, as inflammatory changes are in close proximity to  the urinary bladder.  2. Signs of duplicated IVC.  3. Small hiatal hernia.   Aortic Atherosclerosis (ICD10-I70.0).  ____________________________________________   DIFFERENTIAL DIAGNOSIS   Diverticulitis, renal colic, flank pain, gas pain, strain, shingles  FINAL ASSESSMENT AND PLAN  Left flank pain, Diverticulitis   Plan: The patient had presented for left flank pain. Patient's labs were overall unremarkable. Patient's imaging did reveal mild uncomplicated diverticulitis.  She will be treated with oral  antibiotics and pain medicine and referred for close outpatient follow-up with GI.   Ulice Dash, MD    Note: This note was generated in part or whole with voice recognition software. Voice recognition is usually quite accurate but there are transcription errors that can and very often do occur. I apologize for any typographical errors that were not detected and corrected.     Emily Filbert, MD 10/04/19 [phone]

## 2019-10-02 NOTE — ED Triage Notes (Signed)
Pt c/o LLQ pain with a hx of diverticulitis, states she has been having the pain since Sunday. Denies N/V/D. States she had a normal BM this morning.

## 2019-10-07 NOTE — Telephone Encounter (Signed)
Pt called stating she was not able to return my call last week due to having to go to the ED for the abdominal pain. Pt states the medication given at the hospital gives only short term relief and she will run out of her medication on Wednesday. Pt is very concerned and would like to be seen this week. I explained that Dr. Vicente Males is only in the office on Wednesday morning as he is on Call this week. I offered pt an appointment for Wednesday pt agrees.

## 2019-10-09 ENCOUNTER — Ambulatory Visit (INDEPENDENT_AMBULATORY_CARE_PROVIDER_SITE_OTHER): Payer: Medicare Other | Admitting: Gastroenterology

## 2019-10-09 ENCOUNTER — Other Ambulatory Visit: Payer: Self-pay

## 2019-10-09 ENCOUNTER — Encounter: Payer: Self-pay | Admitting: Gastroenterology

## 2019-10-09 VITALS — BP 170/50 | HR 91 | Temp 97.6°F | Ht 64.0 in | Wt 148.6 lb

## 2019-10-09 DIAGNOSIS — K5792 Diverticulitis of intestine, part unspecified, without perforation or abscess without bleeding: Secondary | ICD-10-CM

## 2019-10-09 DIAGNOSIS — K5900 Constipation, unspecified: Secondary | ICD-10-CM | POA: Diagnosis not present

## 2019-10-09 MED ORDER — AMOXICILLIN-POT CLAVULANATE 875-125 MG PO TABS: 1.0000 | ORAL_TABLET | Freq: Three times a day (TID) | ORAL | 0 refills | Status: AC

## 2019-10-09 NOTE — Progress Notes (Signed)
Jonathon Bellows MD, MRCP(U.K) [address]  [place]  [place], [place] [address]  Main: [phone]  Fax: [phone]   Primary Care Physician: Catheryn Bacon, CNM (Inactive)  Primary Gastroenterologist:  Dr. Jonathon Bellows    ED visit follow-up  HPI: Rebecca Morris is a 73 y.o. female    Summary of history :  She follows with me for diverticulitis since 07/17/2017.  At that time she had an acute episode of diverticulitis of the rectosigmoid junction.  Another episode of diverticulitis on 07/21/2019 of the sigmoid colon.  Treated with Augmentin.  She also suffers from constipation.    Interval history   08/27/2019-10/09/2019  09/23/2019: Colonoscopy: Poor prep stool in the rectosigmoid area.  She presented to the emergency room on 10/02/2019 with abdominal pain in the left lower quadrant. She underwent a CT scan of the abdomen and pelvis.  Noted to have mild uncomplicated diverticulitis.  Suggested repeat imaging as inflammatory changes were very close in proximity to the urinary bladder. She says that she has not been able to take the ciprofloxacin as prescribed as it has been causing diarrhea.  Stopped it a few days back.  Does not have any pain.  But has a poor appetite.   Current Outpatient Medications  Medication Sig Dispense Refill  . atorvastatin (LIPITOR) 20 MG tablet Take 20 mg by mouth daily at 6 PM.     . cetirizine (ZYRTEC) 10 MG tablet Take 10 mg by mouth daily.     . ciprofloxacin (CIPRO) 500 MG tablet Take 1 tablet (500 mg total) by mouth 2 (two) times daily for 10 days. 20 tablet 0  . dicyclomine (BENTYL) 10 MG capsule TAKE 1 CAPSULE (10 MG TOTAL) BY MOUTH 4 (FOUR) TIMES DAILY - BEFORE MEALS AND AT BEDTIME. 120 capsule 5  . estradiol (ESTRACE) 1 MG tablet Take 1 mg by mouth daily.  1  . FLUoxetine (PROZAC) 20 MG tablet Take 20 mg by mouth daily.  1  . metroNIDAZOLE (FLAGYL) 500 MG tablet Take 1 tablet (500 mg total) by mouth 3 (three) times daily. 30 tablet 0  .  montelukast (SINGULAIR) 10 MG tablet Take 10 mg by mouth at bedtime.    . Multiple Vitamin (MULTIVITAMIN) tablet Take 1 tablet by mouth daily.    Marland Kitchen oxyCODONE-acetaminophen (PERCOCET) 5-325 MG tablet Take 1 tablet by mouth 2 (two) times daily as needed. 20 tablet 0   No current facility-administered medications for this visit.     Allergies as of 10/09/2019 - Review Complete 10/02/2019  Allergen Reaction Noted  . Macrodantin [nitrofurantoin macrocrystal] Itching 07/14/2017    ROS:  General: Negative for anorexia, weight loss, fever, chills, fatigue, weakness. ENT: Negative for hoarseness, difficulty swallowing , nasal congestion. CV: Negative for chest pain, angina, palpitations, dyspnea on exertion, peripheral edema.  Respiratory: Negative for dyspnea at rest, dyspnea on exertion, cough, sputum, wheezing.  GI: See history of present illness. GU:  Negative for dysuria, hematuria, urinary incontinence, urinary frequency, nocturnal urination.  Endo: Negative for unusual weight change.    Physical Examination:   There were no vitals taken for this visit.  General: Well-nourished, well-developed in no acute distress.  Eyes: No icterus. Conjunctivae pink. Mouth: Oropharyngeal mucosa moist and pink , no lesions erythema or exudate. Lungs: Clear to auscultation bilaterally. Non-labored. Heart: Regular rate and rhythm, no murmurs rubs or gallops.  Abdomen: Bowel sounds are normal, nontender, nondistended, no hepatosplenomegaly or masses, no abdominal bruits or hernia , no rebound  or guarding.   Extremities: No lower extremity edema. No clubbing or deformities. Neuro: Alert and oriented x 3.  Grossly intact. Skin: Warm and dry, no jaundice.   Psych: Alert and cooperative, normal mood and affect.   Imaging Studies: Ct Abdomen Pelvis W Contrast  Result Date: 10/02/2019 CLINICAL DATA:  Abdominal pain, diverticulitis suspected EXAM: CT ABDOMEN AND PELVIS WITH CONTRAST TECHNIQUE:  Multidetector CT imaging of the abdomen and pelvis was performed using the standard protocol following bolus administration of intravenous contrast. CONTRAST:  OMNIPAQUE IOHEXOL 300 MG/ML  SOLN COMPARISON:  07/24/2019 FINDINGS: Lower chest: Lung bases are clear. No signs of pleural or pericardial effusion. Heart is incompletely imaged. Hepatobiliary: Liver is normal. Gallbladder is moderately distended with suggestion of sludge in the dependent portion. No pericholecystic stranding. No biliary ductal dilation. Pancreas: Unremarkable. No pancreatic ductal dilatation or surrounding inflammatory changes. Spleen: Normal in size without focal abnormality. Adrenals/Urinary Tract: Normal adrenal glands. Symmetric enhancement of the bilateral kidneys without hydronephrosis or nephrolithiasis. Stomach/Bowel: Small hiatal hernia. Small bowel is not. Signs of colonic diverticulosis with mild stranding adjacent to the lateral wall of the sigmoid colon best seen on image 67 of series 2, findings are less severe than on the study of August of 2020. Vascular/Lymphatic: Signs of duplicated IVC. Scattered atherosclerosis. No evidence of aneurysm. No signs of adenopathy. Reproductive: Post hysterectomy. Other: Small fat containing umbilical hernia. Musculoskeletal: Spinal degenerative changes without acute or destructive bone findings. Grade 1 anterolisthesis of L4 on L5. Findings are not changed compared to prior exam. IMPRESSION: 1. Mild uncomplicated diverticulitis, findings are less severe when compared to the previous exam of August of 2020. Follow-up imaging may be helpful, as inflammatory changes are in close proximity to the urinary bladder. 2. Signs of duplicated IVC. 3. Small hiatal hernia. Aortic Atherosclerosis (ICD10-I70.0). Electronically Signed   By: Donzetta Kohut M.D.   On: 10/02/2019 10:14    Assessment and Plan:   Rebecca Morris is a 72 y.o. y/o female here to follow-up after an emergency room visit.   This is the third episode of sigmoid diverticulitis that she has had over the last 2 years.  Last complete colonoscopy was in 2018.  I reattempted a colonoscopy recently but she had a poor prep and was abandoned.  Plan 1.  Repeat CT scan of the abdomen and pelvis in 6 weeks to check for resolution of the inflammation. 2.  Once inflammation is resolved she will need a 2-day prep and repeat colonoscopy to rule out pathology in the sigmoid colon. 3.  Stop ciprofloxacin and Flagyl as she is intolerant to it.  Commenced on Augmentin for 14 days.  Dicyclomine 10 mg 4 times daily and sample for IBgard will be provided. 4.  Briefly talked about surgical options to prevent recurrence of diverticulitis as she seems to be pretty upset with the recurrence of this problem    telephone visit in 1 week to check on how she is doing. Dr [name]  MD,MRCP [facility]) Follow up in 7 to 8 weeks after she has a CT scan of the abdomen

## 2019-10-09 NOTE — Addendum Note (Signed)
Addended by: Dorethea Clan on: 10/09/2019 09:31 AM   Modules accepted: Orders

## 2019-10-16 ENCOUNTER — Ambulatory Visit (INDEPENDENT_AMBULATORY_CARE_PROVIDER_SITE_OTHER): Payer: Medicare Other | Admitting: Gastroenterology

## 2019-10-16 DIAGNOSIS — K5792 Diverticulitis of intestine, part unspecified, without perforation or abscess without bleeding: Secondary | ICD-10-CM | POA: Diagnosis not present

## 2019-10-16 NOTE — Progress Notes (Signed)
Jonathon Bellows , MD [address]  [place]  [place] [address] [place], [place] [address]  Main: [phone]  Fax: [phone]   Primary Care Physician: Catheryn Bacon, CNM (Inactive)  Virtual Visit via Telephone Note  I connected with patient on 10/16/19 at 11:15 AM EST by telephone and verified that I am speaking with the correct person using two identifiers.   I discussed the limitations, risks, security and privacy concerns of performing an evaluation and management service by telephone and the availability of in person appointments. I also discussed with the patient that there may be a patient responsible charge related to this service. The patient expressed understanding and agreed to proceed.  Location of Patient: Home Location of Provider: Home Persons involved: Patient and provider only   History of Present Illness:   Follow-up for diverticulitis  HPI: Rebecca Morris is a 73 y.o. female   Summary of history :  She follows with me for diverticulitis since 07/17/2017.  At that time she had an acute episode of diverticulitis of the rectosigmoid junction.  Another episode of diverticulitis on 07/21/2019 of the sigmoid colon.  Treated with Augmentin.  She also suffers from constipation. 09/23/2019: Colonoscopy: Poor prep stool in the rectosigmoid area.  She presented to the emergency room on 10/02/2019, CT scan of the abdomen demonstrated uncomplicated diverticulitis very close in proximity urinary bladder   Interval history 10/09/2019-10/16/2019 She is doing much better.  No abdominal pain.  1 week of Augmentin left to be completed.  Occasional diarrhea in the mornings and sometimes at night.  Appetite has improved.  She is able to drive.    Current Outpatient Medications  Medication Sig Dispense Refill  . amoxicillin-clavulanate (AUGMENTIN) 875-125 MG tablet Take 1 tablet by mouth 3 (three) times daily for 14 days. 42 tablet 0  . atorvastatin (LIPITOR) 20 MG tablet Take 20 mg by  mouth daily at 6 PM.     . cetirizine (ZYRTEC) 10 MG tablet Take 10 mg by mouth daily.     Marland Kitchen dicyclomine (BENTYL) 10 MG capsule TAKE 1 CAPSULE (10 MG TOTAL) BY MOUTH 4 (FOUR) TIMES DAILY - BEFORE MEALS AND AT BEDTIME. 120 capsule 5  . estradiol (ESTRACE) 1 MG tablet Take 1 mg by mouth daily.  1  . FLUoxetine (PROZAC) 20 MG tablet Take 20 mg by mouth daily.  1  . metroNIDAZOLE (FLAGYL) 500 MG tablet Take 1 tablet (500 mg total) by mouth 3 (three) times daily. 30 tablet 0  . montelukast (SINGULAIR) 10 MG tablet Take 10 mg by mouth at bedtime.    . Multiple Vitamin (MULTIVITAMIN) tablet Take 1 tablet by mouth daily.    Marland Kitchen oxyCODONE-acetaminophen (PERCOCET) 5-325 MG tablet Take 1 tablet by mouth 2 (two) times daily as needed. (Patient not taking: Reported on 10/09/2019) 20 tablet 0   No current facility-administered medications for this visit.     Allergies as of 10/16/2019 - Review Complete 10/09/2019  Allergen Reaction Noted  . Macrodantin [nitrofurantoin macrocrystal] Itching 07/14/2017    Review of Systems:    All systems reviewed and negative except where noted in HPI.   Observations/Objective:  Labs: CMP     Component Value Date/Time   NA 140 10/02/2019 0802   K 4.2 10/02/2019 0802   CL 103 10/02/2019 0802   CO2 25 10/02/2019 0802   GLUCOSE 88 10/02/2019 0802   BUN 20 10/02/2019 0802   CREATININE 0.80 10/02/2019 0802   CALCIUM 9.4 10/02/2019 0802  PROT 8.1 10/02/2019 0802   ALBUMIN 4.1 10/02/2019 0802   AST 34 10/02/2019 0802   ALT 24 10/02/2019 0802   ALKPHOS 50 10/02/2019 0802   BILITOT 0.5 10/02/2019 0802   GFRNONAA >60 10/02/2019 0802   GFRAA >60 10/02/2019 0802   Lab Results  Component Value Date   WBC 8.4 10/02/2019   HGB 13.6 10/02/2019   HCT 42.5 10/02/2019   MCV 97.0 10/02/2019   PLT 439 (H) 10/02/2019    Imaging Studies: Ct Abdomen Pelvis W Contrast  Result Date: 10/02/2019 CLINICAL DATA:  Abdominal pain, diverticulitis suspected EXAM: CT ABDOMEN  AND PELVIS WITH CONTRAST TECHNIQUE: Multidetector CT imaging of the abdomen and pelvis was performed using the standard protocol following bolus administration of intravenous contrast. CONTRAST:  OMNIPAQUE IOHEXOL 300 MG/ML  SOLN COMPARISON:  07/24/2019 FINDINGS: Lower chest: Lung bases are clear. No signs of pleural or pericardial effusion. Heart is incompletely imaged. Hepatobiliary: Liver is normal. Gallbladder is moderately distended with suggestion of sludge in the dependent portion. No pericholecystic stranding. No biliary ductal dilation. Pancreas: Unremarkable. No pancreatic ductal dilatation or surrounding inflammatory changes. Spleen: Normal in size without focal abnormality. Adrenals/Urinary Tract: Normal adrenal glands. Symmetric enhancement of the bilateral kidneys without hydronephrosis or nephrolithiasis. Stomach/Bowel: Small hiatal hernia. Small bowel is not. Signs of colonic diverticulosis with mild stranding adjacent to the lateral wall of the sigmoid colon best seen on image 67 of series 2, findings are less severe than on the study of August of 2020. Vascular/Lymphatic: Signs of duplicated IVC. Scattered atherosclerosis. No evidence of aneurysm. No signs of adenopathy. Reproductive: Post hysterectomy. Other: Small fat containing umbilical hernia. Musculoskeletal: Spinal degenerative changes without acute or destructive bone findings. Grade 1 anterolisthesis of L4 on L5. Findings are not changed compared to prior exam. IMPRESSION: 1. Mild uncomplicated diverticulitis, findings are less severe when compared to the previous exam of August of 2020. Follow-up imaging may be helpful, as inflammatory changes are in close proximity to the urinary bladder. 2. Signs of duplicated IVC. 3. Small hiatal hernia. Aortic Atherosclerosis (ICD10-I70.0). Electronically Signed   By: Donzetta Kohut M.D.   On: 10/02/2019 10:14    Assessment and Plan:   Rebecca Morris is a 73 y.o. y/o female here to follow-up  for acute diverticulitis. This is the third episode of sigmoid diverticulitis that she has had over the last 2 years.  Last complete colonoscopy was in 2018.  I reattempted a colonoscopy recently but she had a poor prep and was abandoned.  Did not tolerate ciprofloxacin and was changed to Augmentin.  Plan 1.  Repeat CT scan of the abdomen and pelvis in 6 weeks to check for resolution of the inflammation. 2.  Once inflammation is resolved she will need a 2-day prep and repeat colonoscopy to rule out pathology in the sigmoid colon. 3.    Will speak to her again in 2 weeks time to see how she is doing.  Briefly discussed again about surgical options especially if this issue recurs again.  I discussed the assessment and treatment plan with the patient. The patient was provided an opportunity to ask questions and all were answered. The patient agreed with the plan and demonstrated an understanding of the instructions.   The patient was advised to call back or seek an in-person evaluation if the symptoms worsen or if the condition fails to improve as anticipated.  I provided 12 minutes of non-face-to-face time during this encounter.  Dr [name] MD,MRCP [facility])  Gastroenterology/Hepatology Pager: [phone]   Speech recognition software was used to dictate this note.

## 2019-10-22 ENCOUNTER — Other Ambulatory Visit: Payer: Self-pay | Admitting: Gastroenterology

## 2019-10-30 ENCOUNTER — Ambulatory Visit (INDEPENDENT_AMBULATORY_CARE_PROVIDER_SITE_OTHER): Payer: Medicare Other | Admitting: Gastroenterology

## 2019-10-30 ENCOUNTER — Other Ambulatory Visit: Payer: Self-pay

## 2019-10-30 DIAGNOSIS — K5792 Diverticulitis of intestine, part unspecified, without perforation or abscess without bleeding: Secondary | ICD-10-CM | POA: Diagnosis not present

## 2019-10-30 NOTE — Progress Notes (Signed)
[name] , MD [address]  [address]  [place], [place] [address]  Main: [phone]  Fax: [phone]   Primary Care Physician: Rebecca Morris, CNM (Inactive)  Virtual Visit via Telephone Note  I connected with patient on 10/30/19 at 10:00 AM EST by telephone and verified that I am speaking with the correct person using two identifiers.   I discussed the limitations, risks, security and privacy concerns of performing an evaluation and management service by telephone and the availability of in person appointments. I also discussed with the patient that there may be a patient responsible charge related to this service. The patient expressed understanding and agreed to proceed.  Location of Patient: Home Location of Provider: Home Persons involved: Patient and provider only   History of Present Illness: Diverticulitis follow-up  HPI: Rebecca Morris is a 73 y.o. female   Summary of history :  She follows with me for diverticulitis since 07/17/2017. At that time she had an acute episode of diverticulitis of the rectosigmoid junction. Another episode of diverticulitis on 07/21/2019 of the sigmoid colon. Treated with Augmentin. She also suffers from constipation. 09/23/2019: Colonoscopy: Poor prep stool in the rectosigmoid area. 10/02/2019: ER visit: CT scan of the abdomen demonstrated uncomplicated diverticulitis very close in proximity urinary bladder.  Treated with Augmentin    Interval history11/18/2020-10/30/2019  No pain.  Only complaint is early morning diarrhea.  Current Outpatient Medications  Medication Sig Dispense Refill  . atorvastatin (LIPITOR) 20 MG tablet Take 20 mg by mouth daily at 6 PM.     . cetirizine (ZYRTEC) 10 MG tablet Take 10 mg by mouth daily.     Marland Kitchen dicyclomine (BENTYL) 10 MG capsule TAKE 1 CAPSULE (10 MG TOTAL) BY MOUTH 4 (FOUR) TIMES DAILY - BEFORE MEALS AND AT BEDTIME. 120 capsule 5  . estradiol (ESTRACE) 1 MG tablet Take 1 mg by  mouth daily.  1  . FLUoxetine (PROZAC) 20 MG tablet Take 20 mg by mouth daily.  1  . metroNIDAZOLE (FLAGYL) 500 MG tablet Take 1 tablet (500 mg total) by mouth 3 (three) times daily. 30 tablet 0  . montelukast (SINGULAIR) 10 MG tablet Take 10 mg by mouth at bedtime.    . Multiple Vitamin (MULTIVITAMIN) tablet Take 1 tablet by mouth daily.    Marland Kitchen oxyCODONE-acetaminophen (PERCOCET) 5-325 MG tablet Take 1 tablet by mouth 2 (two) times daily as needed. (Patient not taking: Reported on 10/09/2019) 20 tablet 0   No current facility-administered medications for this visit.     Allergies as of 10/30/2019 - Review Complete 10/09/2019  Allergen Reaction Noted  . Macrodantin [nitrofurantoin macrocrystal] Itching 07/14/2017    Review of Systems:    All systems reviewed and negative except where noted in HPI.   Observations/Objective:  Labs: CMP     Component Value Date/Time   NA 140 10/02/2019 0802   K 4.2 10/02/2019 0802   CL 103 10/02/2019 0802   CO2 25 10/02/2019 0802   GLUCOSE 88 10/02/2019 0802   BUN 20 10/02/2019 0802   CREATININE 0.80 10/02/2019 0802   CALCIUM 9.4 10/02/2019 0802   PROT 8.1 10/02/2019 0802   ALBUMIN 4.1 10/02/2019 0802   AST 34 10/02/2019 0802   ALT 24 10/02/2019 0802   ALKPHOS 50 10/02/2019 0802   BILITOT 0.5 10/02/2019 0802   GFRNONAA >60 10/02/2019 0802   GFRAA >60 10/02/2019 0802   Lab Results  Component Value Date   WBC 8.4 10/02/2019  HGB 13.6 10/02/2019   HCT 42.5 10/02/2019   MCV 97.0 10/02/2019   PLT 439 (H) 10/02/2019    Imaging Studies: Ct Abdomen Pelvis W Contrast  Result Date: 10/02/2019 CLINICAL DATA:  Abdominal pain, diverticulitis suspected EXAM: CT ABDOMEN AND PELVIS WITH CONTRAST TECHNIQUE: Multidetector CT imaging of the abdomen and pelvis was performed using the standard protocol following bolus administration of intravenous contrast. CONTRAST:  197mL OMNIPAQUE IOHEXOL 300 MG/ML  SOLN COMPARISON:  07/24/2019 FINDINGS: Lower chest:  Lung bases are clear. No signs of pleural or pericardial effusion. Heart is incompletely imaged. Hepatobiliary: Liver is normal. Gallbladder is moderately distended with suggestion of sludge in the dependent portion. No pericholecystic stranding. No biliary ductal dilation. Pancreas: Unremarkable. No pancreatic ductal dilatation or surrounding inflammatory changes. Spleen: Normal in size without focal abnormality. Adrenals/Urinary Tract: Normal adrenal glands. Symmetric enhancement of the bilateral kidneys without hydronephrosis or nephrolithiasis. Stomach/Bowel: Small hiatal hernia. Small bowel is not. Signs of colonic diverticulosis with mild stranding adjacent to the lateral wall of the sigmoid colon best seen on image 67 of series 2, findings are less severe than on the study of August of 2020. Vascular/Lymphatic: Signs of duplicated IVC. Scattered atherosclerosis. No evidence of aneurysm. No signs of adenopathy. Reproductive: Post hysterectomy. Other: Small fat containing umbilical hernia. Musculoskeletal: Spinal degenerative changes without acute or destructive bone findings. Grade 1 anterolisthesis of L4 on L5. Findings are not changed compared to prior exam. IMPRESSION: 1. Mild uncomplicated diverticulitis, findings are less severe when compared to the previous exam of August of 2020. Follow-up imaging may be helpful, as inflammatory changes are in close proximity to the urinary bladder. 2. Signs of duplicated IVC. 3. Small hiatal hernia. Aortic Atherosclerosis (ICD10-I70.0). Electronically Signed   By: Zetta Bills M.D.   On: 10/02/2019 10:14    Assessment and Plan:   Rebecca Morris is a 73 y.o. y/o female here to follow-up for acute diverticulitis.This is the third episode of sigmoid diverticulitis that she has had over the last 2 years. Last complete colonoscopy was in 2018. I reattempted a colonoscopy recently but she had a poor prep and was abandoned.  Did not tolerate ciprofloxacin and was  changed to Augmentin.  Plan 1. Repeat CT scan of the abdomen and pelvis to check for resolution of the inflammation. 2. Once inflammation is resolved she will need a 2-day prep and repeat colonoscopy to rule out pathology in the sigmoid colon.     I discussed the assessment and treatment plan with the patient. The patient was provided an opportunity to ask questions and all were answered. The patient agreed with the plan and demonstrated an understanding of the instructions.   The patient was advised to call back or seek an in-person evaluation if the symptoms worsen or if the condition fails to improve as anticipated.  I provided 8 minutes of non-face-to-face time during this encounter.  Dr Jonathon Bellows MD,MRCP [facility]) Gastroenterology/Hepatology Pager: [phone]   Speech recognition software was used to dictate this note.

## 2019-11-11 ENCOUNTER — Other Ambulatory Visit: Payer: Self-pay

## 2019-11-11 ENCOUNTER — Ambulatory Visit
Admission: RE | Admit: 2019-11-11 | Discharge: 2019-11-11 | Disposition: A | Discharge status: Home / Self Care | Payer: Medicare Other | Source: Ambulatory Visit | Attending: Gastroenterology | Admitting: Gastroenterology

## 2019-11-11 DIAGNOSIS — K5792 Diverticulitis of intestine, part unspecified, without perforation or abscess without bleeding: Secondary | ICD-10-CM | POA: Insufficient documentation

## 2019-11-11 MED ORDER — IOHEXOL 300 MG/ML  SOLN
100.0000 mL | Freq: Once | INTRAMUSCULAR | Status: AC | PRN
  Administered 2019-11-11: 100 mL via INTRAVENOUS

## 2019-11-12 ENCOUNTER — Encounter: Payer: Self-pay | Admitting: Gastroenterology

## 2019-11-13 ENCOUNTER — Telehealth: Payer: Self-pay

## 2019-11-13 NOTE — Telephone Encounter (Signed)
-----   Message from Jonathon Bellows, MD sent at 11/12/2019  8:12 AM EST ----- Inform diverticulitis has resolved, schedule for colonoscopy with 2 day prep .   Dr Vicente Males

## 2019-11-13 NOTE — Telephone Encounter (Signed)
Spoke with pt and informed her of CT scan results and Dr. Georgeann Oppenheim recommendation to proceed with scheduling the colonoscopy. Pt states she'd like to hold off on the colonoscopy until late January or February, pt plans to contact our office when she's ready to schedule.

## 2020-01-29 ENCOUNTER — Other Ambulatory Visit: Payer: Self-pay | Admitting: Gastroenterology

## 2020-01-29 DIAGNOSIS — K5792 Diverticulitis of intestine, part unspecified, without perforation or abscess without bleeding: Secondary | ICD-10-CM

## 2020-03-05 ENCOUNTER — Encounter: Payer: Self-pay | Admitting: Emergency Medicine

## 2020-03-05 ENCOUNTER — Ambulatory Visit
Admission: EM | Admit: 2020-03-05 | Discharge: 2020-03-05 | Disposition: A | Discharge status: Home / Self Care | Payer: Medicare PPO | Attending: Family Medicine | Admitting: Family Medicine

## 2020-03-05 ENCOUNTER — Other Ambulatory Visit: Payer: Self-pay

## 2020-03-05 DIAGNOSIS — J0110 Acute frontal sinusitis, unspecified: Secondary | ICD-10-CM

## 2020-03-05 HISTORY — DX: Hyperlipidemia, unspecified: E78.5

## 2020-03-05 HISTORY — DX: Anxiety disorder, unspecified: F41.9

## 2020-03-05 HISTORY — DX: Diverticulosis of intestine, part unspecified, without perforation or abscess without bleeding: K57.90

## 2020-03-05 MED ORDER — AMOXICILLIN-POT CLAVULANATE 875-125 MG PO TABS: 1.0000 | ORAL_TABLET | Freq: Two times a day (BID) | ORAL | 0 refills | Status: DC

## 2020-03-05 NOTE — ED Triage Notes (Addendum)
Patient in today c/o a 6 day history of sinus pressure, headache, sneezing and dry cough at night. Patient denies fever. Patient has taken OTC Tylenol and Nyquil last night.

## 2020-03-05 NOTE — ED Provider Notes (Signed)
[facility]    CSN: [id number] Arrival date & time: 03/05/20  1053      History   Chief Complaint Chief Complaint  Patient presents with  . Sinus Problem  . Headache  . sneezing   HPI  74 year old female presents with the above complaints.  60 history of sinus pain, pressure, headache, sneezing, dry cough.  She has taken over-the-counter medications without relief.  No sick contacts.  Patient is fully vaccinated against COVID-19.  Patient states that she has a history of sinusitis and that this feels the same as her prior infections.  Worsening.  No fever.  No known exacerbating factors.  No other associated symptoms.  Past Medical History:  Diagnosis Date  . Anxiety   . Diverticulosis   . Hyperlipidemia    Past Surgical History:  Procedure Laterality Date  . ABDOMINAL HYSTERECTOMY    . APPENDECTOMY    . BREAST CYST ASPIRATION Left 1988   asp cyst  . COLONOSCOPY    . COLONOSCOPY WITH PROPOFOL N/A 09/14/2017   Procedure: COLONOSCOPY WITH PROPOFOL;  Surgeon: [name], MD;  Location: [facility] ENDOSCOPY;  Service: Gastroenterology;  Laterality: N/A;  . COLONOSCOPY WITH PROPOFOL N/A 09/23/2019   Procedure: COLONOSCOPY WITH PROPOFOL;  Surgeon: [name], MD;  Location: [facility] ENDOSCOPY;  Service: Gastroenterology;  Laterality: N/A;    OB History   No obstetric history on file.      Home Medications    Prior to Admission medications   Medication Sig Start Date End Date Taking? Authorizing Provider  atorvastatin (LIPITOR) 20 MG tablet Take 20 mg by mouth daily at 6 PM.  06/23/17  Yes [provider]  azelastine (ASTELIN) 0.1 % nasal spray Place 1 spray into both nostrils 2 (two) times daily. 01/29/20  Yes [provider]  dicyclomine (BENTYL) 10 MG capsule Take 1 capsule (10 mg total) by mouth 4 (four) times daily -  before meals and at bedtime. 01/29/20  Yes [name], MD  estradiol (ESTRACE) 1 MG tablet Take 1 mg by mouth daily. 07/20/17  Yes  [provider]  FLUoxetine (PROZAC) 20 MG tablet Take 20 mg by mouth daily. 06/09/17  Yes [provider]  Multiple Vitamin (MULTIVITAMIN) tablet Take 1 tablet by mouth daily.   Yes [provider]  amoxicillin-clavulanate (AUGMENTIN) 875-125 MG tablet Take 1 tablet by mouth every 12 (twelve) hours. 03/05/20   Tommie Sams, DO  montelukast (SINGULAIR) 10 MG tablet Take 10 mg by mouth at bedtime. 08/30/19   [provider]  cetirizine (ZYRTEC) 10 MG tablet Take 10 mg by mouth daily.   03/05/20  [provider]    Family History Family History  Problem Relation Age of Onset  . Breast cancer Mother 62  . Hypertension Mother   . Lymphoma Father     Social History Social History   Tobacco Use  . Smoking status: Never Smoker  . Smokeless tobacco: Never Used  Substance Use Topics  . Alcohol use: No  . Drug use: No     Allergies   Cipro [ciprofloxacin-ciproflox hcl er] and Macrodantin [nitrofurantoin macrocrystal]   Review of Systems Review of Systems Per HPI  Physical Exam Triage Vital Signs ED Triage Vitals  Enc Vitals Group     BP 03/05/20 1119 (!) 128/57     Pulse Rate 03/05/20 1119 78     Resp 03/05/20 1119 18     Temp 03/05/20 1119 98.1 F (36.7 C)  Temp Source 03/05/20 1119 Oral     SpO2 03/05/20 1119 97 %     Weight 03/05/20 1120 145 lb (65.8 kg)     Height 03/05/20 1120 5\' 3"  (1.6 m)     Head Circumference --      Peak Flow --      Pain Score 03/05/20 1119 4     Pain Loc --      Pain Edu? --      Excl. in [place]? --    No data found.  Updated Vital Signs BP (!) 128/57 (BP Location: Left Arm)   Pulse 78   Temp 98.1 F (36.7 C) (Oral)   Resp 18   Ht 5\' 3"  (1.6 m)   Wt 65.8 kg   SpO2 97%   BMI 25.69 kg/m   Visual Acuity Right Eye Distance:   Left Eye Distance:   Bilateral Distance:    Right Eye Near:   Left Eye Near:    Bilateral Near:     Physical Exam Vitals and nursing note reviewed.   Constitutional:      General: She is not in acute distress.    Appearance: Normal appearance. She is not ill-appearing.  HENT:     Head: Normocephalic and atraumatic.     Mouth/Throat:     Pharynx: Oropharynx is clear. No posterior oropharyngeal erythema.  Eyes:     General:        Right eye: No discharge.        Left eye: No discharge.     Conjunctiva/sclera: Conjunctivae normal.  Cardiovascular:     Rate and Rhythm: Normal rate and regular rhythm.     Heart sounds: No murmur.  Pulmonary:     Effort: Pulmonary effort is normal.     Breath sounds: Normal breath sounds. No wheezing, rhonchi or rales.  Neurological:     Mental Status: She is alert.  Psychiatric:        Mood and Affect: Mood normal.        Behavior: Behavior normal.     UC Treatments / Results  Labs (all labs ordered are listed, but only abnormal results are displayed) Labs Reviewed - No data to display  EKG   Radiology No results found.  Procedures Procedures (including critical care time)  Medications Ordered in UC Medications - No data to display  Initial Impression / Assessment and Plan / UC Course  I have reviewed the triage vital signs and the nursing notes.  Pertinent labs & imaging results that were available during my care of the patient were reviewed by me and considered in my medical decision making (see chart for details).    74 year old female presents with frontal sinusitis.  Treating with Augmentin.  Final Clinical Impressions(s) / UC Diagnoses   Final diagnoses:  Acute frontal sinusitis, recurrence not specified     Discharge Instructions     Antibiotic as prescribed.  Take care  Dr. Lacinda Axon    ED Prescriptions    Medication Sig Dispense Auth. Provider   amoxicillin-clavulanate (AUGMENTIN) 875-125 MG tablet Take 1 tablet by mouth every 12 (twelve) hours. 20 tablet Coral Spikes, DO     PDMP not reviewed this encounter.   Coral Spikes, [place] 03/05/20 1242

## 2020-03-05 NOTE — Discharge Instructions (Signed)
Antibiotic as prescribed.  Take care  Dr. Rosmery Duggin  

## 2020-03-10 ENCOUNTER — Ambulatory Visit: Payer: Medicare PPO | Admitting: Gastroenterology

## 2020-03-10 ENCOUNTER — Other Ambulatory Visit: Payer: Self-pay

## 2020-03-10 VITALS — BP 125/67 | HR 75 | Temp 97.6°F | Ht 64.0 in | Wt 149.0 lb

## 2020-03-10 DIAGNOSIS — K5792 Diverticulitis of intestine, part unspecified, without perforation or abscess without bleeding: Secondary | ICD-10-CM

## 2020-03-10 NOTE — Progress Notes (Signed)
[name] MD, MRCP(U.K) [address]  [address]  [place], [place] [address]  Main: [phone]  Fax: [phone]   Primary Care Physician: Sharee Pimple, CNM (Inactive)  Primary Gastroenterologist:  Dr. [name]  Follow-up for diverticulitis  HPI: Rebecca Morris is a 74 y.o. female  Summary of history :  She follows with me for diverticulitis since 07/17/2017 which was her first episode affecting the rectosigmoid junction.  Second episode on 07/21/2019 of the sigmoid colon. Treated with Augmentin. She also suffers from constipation.  Third episode in November 2020 in the location with close proximity to the urinary bladder.  Treated with Augmentin. 09/23/2019: Colonoscopy: Poor prep stool in the rectosigmoid area.    Interval history12/12/2018-03/10/2020 11/11/2019: CT scan of the abdomen and pelvis with contrast: Sigmoid diverticulosis without evidence of diverticulitis She is doing well no complaints no abdominal pain.  Current Outpatient Medications  Medication Sig Dispense Refill  . amoxicillin-clavulanate (AUGMENTIN) 875-125 MG tablet Take 1 tablet by mouth every 12 (twelve) hours. 20 tablet 0  . atorvastatin (LIPITOR) 20 MG tablet Take 20 mg by mouth daily at 6 PM.     . azelastine (ASTELIN) 0.1 % nasal spray Place 1 spray into both nostrils 2 (two) times daily.    Marland Kitchen dicyclomine (BENTYL) 10 MG capsule Take 1 capsule (10 mg total) by mouth 4 (four) times daily -  before meals and at bedtime. 360 capsule 1  . estradiol (ESTRACE) 1 MG tablet Take 1 mg by mouth daily.  1  . FLUoxetine (PROZAC) 20 MG tablet Take 20 mg by mouth daily.  1  . montelukast (SINGULAIR) 10 MG tablet Take 10 mg by mouth at bedtime.    . Multiple Vitamin (MULTIVITAMIN) tablet Take 1 tablet by mouth daily.     No current facility-administered medications for this visit.    Allergies as of 03/10/2020 - Review Complete 03/05/2020  Allergen Reaction Noted  . Cipro [ciprofloxacin-ciproflox  hcl er] Diarrhea 11/11/2019  . Macrodantin [nitrofurantoin macrocrystal] Itching 07/14/2017    ROS:  General: Negative for anorexia, weight loss, fever, chills, fatigue, weakness. ENT: Negative for hoarseness, difficulty swallowing , nasal congestion. CV: Negative for chest pain, angina, palpitations, dyspnea on exertion, peripheral edema.  Respiratory: Negative for dyspnea at rest, dyspnea on exertion, cough, sputum, wheezing.  GI: See history of present illness. GU:  Negative for dysuria, hematuria, urinary incontinence, urinary frequency, nocturnal urination.  Endo: Negative for unusual weight change.    Physical Examination:   There were no vitals taken for this visit.  General: Well-nourished, well-developed in no acute distress.  Eyes: No icterus. Conjunctivae pink. Abdomen: Bowel sounds are normal, nontender, nondistended, no hepatosplenomegaly or masses, no abdominal bruits or hernia , no rebound or guarding.   Neuro: Alert and oriented x 3.  Grossly intact. Psych: Alert and cooperative, normal mood and affect.   Imaging Studies: No results found.  Assessment and Plan:   Rebecca Morris is a 74 y.o. y/o femalehere to follow-up for acute diverticulitis.This is the third episode of sigmoid diverticulitis that she has had over the last 2 years. Last complete colonoscopy was in 2018. I reattempted a colonoscopy recently but she had a poor prep and was abandoned.Did not tolerate ciprofloxacin and was changed to Augmentin.  Repeat CT scan in December 2020 demonstrated resolution of the diverticulitis.       Plan 1. She will require a 2-day prep for colonoscopy due to a poor result when last attempted in  October 2020.  Suggest a dose of magnesium citrate followed by bowel prep.  24 to 48 hours of clears prior to the procedure.  Should schedule on a Monday morning in case she is not clear can be rescheduled for the following day  I have discussed alternative options, risks  & benefits,  which include, but are not limited to, bleeding, infection, perforation,respiratory complication & drug reaction.  The patient agrees with this plan & written consent will be obtained.     Dr Jonathon Bellows  MD,MRCP [facility]) Follow up in as needed

## 2020-03-11 NOTE — Addendum Note (Signed)
Addended by: Daisy Blossom on: 03/11/2020 01:48 PM   Modules accepted: Orders, SmartSet

## 2020-04-02 ENCOUNTER — Other Ambulatory Visit
Admission: RE | Admit: 2020-04-02 | Discharge: 2020-04-02 | Disposition: A | Discharge status: Home / Self Care | Payer: Medicare PPO | Source: Ambulatory Visit | Attending: Gastroenterology | Admitting: Gastroenterology

## 2020-04-02 ENCOUNTER — Other Ambulatory Visit: Payer: Self-pay

## 2020-04-02 DIAGNOSIS — Z20822 Contact with and (suspected) exposure to covid-19: Secondary | ICD-10-CM | POA: Diagnosis not present

## 2020-04-02 DIAGNOSIS — Z01812 Encounter for preprocedural laboratory examination: Secondary | ICD-10-CM | POA: Diagnosis present

## 2020-04-02 LAB — SARS CORONAVIRUS 2 (TAT 6-24 HRS): SARS Coronavirus 2: NEGATIVE

## 2020-04-06 ENCOUNTER — Other Ambulatory Visit: Payer: Self-pay

## 2020-04-06 ENCOUNTER — Ambulatory Visit: Payer: Medicare PPO | Admitting: Anesthesiology

## 2020-04-06 ENCOUNTER — Encounter
Admission: RE | Disposition: A | Discharge status: Home / Self Care | Payer: Self-pay | Source: Home / Self Care | Attending: Gastroenterology

## 2020-04-06 ENCOUNTER — Ambulatory Visit
Admission: RE | Admit: 2020-04-06 | Discharge: 2020-04-06 | Disposition: A | Discharge status: Home / Self Care | Payer: Medicare PPO | Attending: Gastroenterology | Admitting: Gastroenterology

## 2020-04-06 DIAGNOSIS — K5730 Diverticulosis of large intestine without perforation or abscess without bleeding: Secondary | ICD-10-CM | POA: Insufficient documentation

## 2020-04-06 DIAGNOSIS — K5792 Diverticulitis of intestine, part unspecified, without perforation or abscess without bleeding: Secondary | ICD-10-CM

## 2020-04-06 DIAGNOSIS — Z8249 Family history of ischemic heart disease and other diseases of the circulatory system: Secondary | ICD-10-CM | POA: Diagnosis not present

## 2020-04-06 DIAGNOSIS — Z79899 Other long term (current) drug therapy: Secondary | ICD-10-CM | POA: Diagnosis not present

## 2020-04-06 DIAGNOSIS — E785 Hyperlipidemia, unspecified: Secondary | ICD-10-CM | POA: Insufficient documentation

## 2020-04-06 DIAGNOSIS — Z881 Allergy status to other antibiotic agents status: Secondary | ICD-10-CM | POA: Diagnosis not present

## 2020-04-06 DIAGNOSIS — F419 Anxiety disorder, unspecified: Secondary | ICD-10-CM | POA: Diagnosis not present

## 2020-04-06 HISTORY — PX: COLONOSCOPY WITH PROPOFOL: SHX5780

## 2020-04-06 SURGERY — COLONOSCOPY WITH PROPOFOL
Anesthesia: General

## 2020-04-06 MED ORDER — PROPOFOL 10 MG/ML IV BOLUS
INTRAVENOUS | Status: DC | PRN
  Administered 2020-04-06 (×2): 20 mg via INTRAVENOUS
  Administered 2020-04-06: 50 mg via INTRAVENOUS

## 2020-04-06 MED ORDER — PROPOFOL 500 MG/50ML IV EMUL
INTRAVENOUS | Status: DC | PRN
  Administered 2020-04-06: 150 ug/kg/min via INTRAVENOUS

## 2020-04-06 MED ORDER — SODIUM CHLORIDE 0.9 % IV SOLN
INTRAVENOUS | Status: DC
  Administered 2020-04-06: 1000 mL via INTRAVENOUS

## 2020-04-06 NOTE — Anesthesia Procedure Notes (Signed)
Date/Time: 04/06/2020 9:46 AM Performed by: Junious Silk, CRNA Pre-anesthesia Checklist: Patient identified, Emergency Drugs available, Suction available, Patient being monitored and Timeout performed

## 2020-04-06 NOTE — Anesthesia Preprocedure Evaluation (Signed)
Anesthesia Evaluation  Patient identified by MRN, date of birth, ID band Patient awake    Reviewed: Allergy & Precautions, NPO status , Patient's Chart, lab work & pertinent test results  History of Anesthesia Complications Negative for: history of anesthetic complications  Airway Mallampati: II       Dental   Pulmonary neg sleep apnea, neg COPD, Recent URI  (sinusitis last month, completely resolved), Not current smoker,           Cardiovascular (-) hypertension(-) Past MI and (-) CHF (-) dysrhythmias (-) Valvular Problems/Murmurs     Neuro/Psych neg Seizures Anxiety    GI/Hepatic Neg liver ROS, neg GERD  ,  Endo/Other  neg diabetes  Renal/GU negative Renal ROS     Musculoskeletal   Abdominal   Peds  Hematology   Anesthesia Other Findings   Reproductive/Obstetrics                            Anesthesia Physical Anesthesia Plan  ASA: II  Anesthesia Plan: General   Post-op Pain Management:    Induction: Intravenous  PONV Risk Score and Plan: 3 and Propofol infusion, TIVA and Treatment may vary due to age or medical condition  Airway Management Planned: Nasal Cannula  Additional Equipment:   Intra-op Plan:   Post-operative Plan:   Informed Consent: I have reviewed the patients History and Physical, chart, labs and discussed the procedure including the risks, benefits and alternatives for the proposed anesthesia with the patient or authorized representative who has indicated his/her understanding and acceptance.       Plan Discussed with:   Anesthesia Plan Comments:         Anesthesia Quick Evaluation

## 2020-04-06 NOTE — H&P (Signed)
[name], MD [address], [place], [place], [address] [address], [place], [place], [address] Phone: [phone]  Fax: [phone]  Primary Care Physician:  Sharee Pimple, CNM (Inactive)   Pre-Procedure History & Physical: HPI:  Rebecca Morris is a 74 y.o. female is here for an colonoscopy.   Past Medical History:  Diagnosis Date  . Anxiety   . Diverticulosis   . Hyperlipidemia     Past Surgical History:  Procedure Laterality Date  . ABDOMINAL HYSTERECTOMY    . APPENDECTOMY    . BREAST CYST ASPIRATION Left 1988   asp cyst  . COLONOSCOPY    . COLONOSCOPY WITH PROPOFOL N/A 09/14/2017   Procedure: COLONOSCOPY WITH PROPOFOL;  Surgeon: [name], MD;  Location: [facility] ENDOSCOPY;  Service: Gastroenterology;  Laterality: N/A;  . COLONOSCOPY WITH PROPOFOL N/A 09/23/2019   Procedure: COLONOSCOPY WITH PROPOFOL;  Surgeon: [name], MD;  Location: [facility] ENDOSCOPY;  Service: Gastroenterology;  Laterality: N/A;    Prior to Admission medications   Medication Sig Start Date End Date Taking? Authorizing Provider  amoxicillin-clavulanate (AUGMENTIN) 875-125 MG tablet Take 1 tablet by mouth every 12 (twelve) hours. 03/05/20  Yes Cook, Jayce G, DO  atorvastatin (LIPITOR) 20 MG tablet Take 20 mg by mouth daily at 6 PM.  06/23/17  Yes [provider]  azelastine (ASTELIN) 0.1 % nasal spray Place 1 spray into both nostrils 2 (two) times daily. 01/29/20  Yes [provider]  dicyclomine (BENTYL) 10 MG capsule Take 1 capsule (10 mg total) by mouth 4 (four) times daily -  before meals and at bedtime. 01/29/20  Yes [name], MD  estradiol (ESTRACE) 1 MG tablet Take 1 mg by mouth daily. 07/20/17  Yes [provider]  FLUoxetine (PROZAC) 20 MG tablet Take 20 mg by mouth daily. 06/09/17  Yes [provider]  montelukast (SINGULAIR) 10 MG tablet Take 10 mg by mouth at bedtime. 08/30/19  Yes [provider]  Multiple Vitamin  (MULTIVITAMIN) tablet Take 1 tablet by mouth daily.   Yes [provider]  cetirizine (ZYRTEC) 10 MG tablet Take 10 mg by mouth daily.   03/05/20  [provider]    Allergies as of 03/11/2020 - Review Complete 03/10/2020  Allergen Reaction Noted  . Cipro [ciprofloxacin-ciproflox hcl er] Diarrhea 11/11/2019  . Macrodantin [nitrofurantoin macrocrystal] Itching 07/14/2017    Family History  Problem Relation Age of Onset  . Breast cancer Mother 69  . Hypertension Mother   . Lymphoma Father     Social History   Socioeconomic History  . Marital status: Married    Spouse name: Not on file  . Number of children: Not on file  . Years of education: Not on file  . Highest education level: Not on file  Occupational History  . Not on file  Tobacco Use  . Smoking status: Never Smoker  . Smokeless tobacco: Never Used  Substance and Sexual Activity  . Alcohol use: No  . Drug use: No  . Sexual activity: Yes    Birth control/protection: Post-menopausal  Other Topics Concern  . Not on file  Social History Narrative  . Not on file   Social Determinants of Health   Financial Resource Strain:   . Difficulty of Paying Living Expenses:   Food Insecurity:   . Worried About Programme researcher, broadcasting/film/video in the Last Year:   . Barista in the Last Year:   [employer]  Needs:   . Lack of Transportation (Medical):   Marland Kitchen Lack of Transportation (Non-Medical):   Physical Activity:   . Days of Exercise per Week:   . Minutes of Exercise per Session:   Stress:   . Feeling of Stress :   Social Connections:   . Frequency of Communication with Friends and Family:   . Frequency of Social Gatherings with Friends and Family:   . Attends Religious Services:   . Active Member of Clubs or Organizations:   . Attends Archivist Meetings:   Marland Kitchen Marital Status:   Intimate Partner Violence:   . Fear of Current or Ex-Partner:   . Emotionally Abused:   Marland Kitchen Physically Abused:   .  Sexually Abused:     Review of Systems: See HPI, otherwise negative ROS  Physical Exam: BP (!) 129/57   Pulse 93   Temp (!) 96.5 F (35.8 C) (Temporal)   Resp 18   Ht 5\' 3"  (1.6 m)   Wt 62.1 kg   SpO2 99%   BMI 24.27 kg/m  General:   Alert,  pleasant and cooperative in NAD Head:  Normocephalic and atraumatic. Neck:  Supple; no masses or thyromegaly. Lungs:  Clear throughout to auscultation, normal respiratory effort.    Heart:  +S1, +S2, Regular rate and rhythm, No edema. Abdomen:  Soft, nontender and nondistended. Normal bowel sounds, without guarding, and without rebound.   Neurologic:  Alert and  oriented x4;  grossly normal neurologically.  Impression/Plan: [name] is here for an colonoscopy to be performed for diverticulitis Risks, benefits, limitations, and alternatives regarding  colonoscopy have been reviewed with the patient.  Questions have been answered.  All parties agreeable.   Jonathon Bellows, MD  04/06/2020, 9:18 AM

## 2020-04-06 NOTE — Transfer of Care (Signed)
Immediate Anesthesia Transfer of Care Note  Patient: Rebecca Morris  Procedure(s) Performed: COLONOSCOPY WITH PROPOFOL (N/A )  Patient Location: PACU  Anesthesia Type:General  Level of Consciousness: awake, alert  and oriented  Airway & Oxygen Therapy: Patient Spontanous Breathing and Patient connected to nasal cannula oxygen  Post-op Assessment: Report given to RN and Post -op Vital signs reviewed and stable  Post vital signs: Reviewed and stable  Last Vitals:  Vitals Value Taken Time  BP    Temp    Pulse    Resp    SpO2      Last Pain:  Vitals:   04/06/20 0834  TempSrc: Temporal  PainSc: 0-No pain         Complications: No apparent anesthesia complications

## 2020-04-06 NOTE — Anesthesia Postprocedure Evaluation (Signed)
Anesthesia Post Note  Patient: Rebecca Morris  Procedure(s) Performed: COLONOSCOPY WITH PROPOFOL (N/A )  Patient location during evaluation: Endoscopy Anesthesia Type: General Level of consciousness: awake and alert Pain management: pain level controlled Vital Signs Assessment: post-procedure vital signs reviewed and stable Respiratory status: spontaneous breathing and respiratory function stable Cardiovascular status: stable Anesthetic complications: no     Last Vitals:  Vitals:   04/06/20 0834 04/06/20 0951  BP: (!) 129/57 133/61  Pulse: 93 82  Resp: 18 20  Temp: (!) 35.8 C   SpO2: 99% 99%    Last Pain:  Vitals:   04/06/20 0834  TempSrc: Temporal  PainSc: 0-No pain                 KEPHART,WILLIAM K

## 2020-04-06 NOTE — Op Note (Signed)
[facility]astroenter[facility] Patient Name: Rebecca Morris Procedure Date: 04/06/2020 9:18 AM MRN: [id number] Account #: [id number] Date of Birth: 01-08-1946 Admit Type: Outpatient Age: 74 Room: [facility] ENDO ROOM 4 Gender: Female Note Status: Finalized Procedure:             Colonoscopy Indications:           Follow-up of diverticulitis Providers:             [name] MD, MD Medicines:             Monitored Anesthesia Care Complications:         No immediate complications. Procedure:             Pre-Anesthesia Assessment:                        - Prior to the procedure, a History and Physical was                         performed, and patient medications, allergies and                         sensitivities were reviewed. The patient's tolerance                         of previous anesthesia was reviewed.                        - The risks and benefits of the procedure and the                         sedation options and risks were discussed with the                         patient. All questions were answered and informed                         consent was obtained.                        - ASA Grade Assessment: II - A patient with mild                         systemic disease.                        After obtaining informed consent, the colonoscope was                         passed under direct vision. Throughout the procedure,                         the patient's blood pressure, pulse, and oxygen                         saturations were monitored continuously. The                         Colonoscope was introduced through the anus and  advanced to the the cecum, identified by the                         appendiceal orifice. The colonoscopy was technically                         difficult and complex due to a tortuous colon.                         Successful completion of the procedure was aided by                         withdrawing the  scope and replacing with the pediatric                         colonoscope. The quality of the bowel preparation was                         adequate. Findings:      The perianal and digital rectal examinations were normal.      Multiple small and large-mouthed diverticula were found in the sigmoid       colon.      The exam was otherwise without abnormality on direct and retroflexion       views. Impression:            - Diverticulosis in the sigmoid colon.                        - The examination was otherwise normal on direct and                         retroflexion views.                        - No specimens collected. Recommendation:        - Discharge patient to home (with escort).                        - Resume previous diet.                        - Continue present medications. Procedure Code(s):     --- Professional ---                        [id number], Colonoscopy, flexible; diagnostic, including                         collection of specimen(s) by brushing or washing, when                         performed (separate procedure) Diagnosis Code(s):     --- Professional ---                        O84.16, Diverticulitis of large intestine without                         perforation or abscess without bleeding  K57.30, Diverticulosis of large intestine without                         perforation or abscess without bleeding CPT copyright 2019 American Medical Association. All rights reserved. The codes documented in this report are preliminary and upon coder review may  be revised to meet current compliance requirements. Jonathon Bellows, MD Jonathon Bellows MD, MD 04/06/2020 9:49:08 AM This report has been signed electronically. Number of Addenda: 0 Note Initiated On: 04/06/2020 9:18 AM Scope Withdrawal Time: 0 hours 6 minutes 23 seconds  Total Procedure Duration: 0 hours 14 minutes 55 seconds  Estimated Blood Loss:  Estimated blood loss: none.      [facility]

## 2020-04-07 ENCOUNTER — Encounter: Payer: Self-pay | Admitting: *Deleted

## 2020-04-07 ENCOUNTER — Telehealth: Payer: Self-pay

## 2020-04-07 DIAGNOSIS — R1032 Left lower quadrant pain: Secondary | ICD-10-CM

## 2020-04-07 DIAGNOSIS — K5792 Diverticulitis of intestine, part unspecified, without perforation or abscess without bleeding: Secondary | ICD-10-CM

## 2020-04-07 MED ORDER — DICYCLOMINE HCL 10 MG PO CAPS: 10.0000 mg | ORAL_CAPSULE | Freq: Three times a day (TID) | ORAL | 1 refills | Status: DC

## 2020-04-07 NOTE — Telephone Encounter (Signed)
Called patient back and she stated that her abdominal pain is better, however, she is having intermittent LLQ pain. Patient was advised to start taking Bentyl that would be sent to her pharmacy. Patient was also informed to go to the ED if her abdominal pain gets worse. Patient understood and had no further questions.

## 2020-04-07 NOTE — Telephone Encounter (Signed)
Patient called stating that she had her colonoscopy done yesterday and right after she had it done, she developed lower abdominal pain and she would like to know what she could do or take. Please advise.

## 2020-04-07 NOTE — Telephone Encounter (Signed)
If its mild she can try bentyl, if severe she needs to go to the ER. IF persists needs to go to the ER

## 2020-05-18 ENCOUNTER — Other Ambulatory Visit: Payer: Self-pay

## 2020-05-18 ENCOUNTER — Ambulatory Visit
Admission: EM | Admit: 2020-05-18 | Discharge: 2020-05-18 | Disposition: A | Discharge status: Home / Self Care | Payer: Medicare PPO | Attending: Emergency Medicine | Admitting: Emergency Medicine

## 2020-05-18 DIAGNOSIS — R519 Headache, unspecified: Secondary | ICD-10-CM

## 2020-05-18 DIAGNOSIS — R0981 Nasal congestion: Secondary | ICD-10-CM

## 2020-05-18 MED ORDER — AZITHROMYCIN 250 MG PO TABS
ORAL_TABLET | ORAL | 0 refills | Status: DC
Start: 2020-05-18 — End: 2020-08-31

## 2020-05-18 NOTE — Discharge Instructions (Addendum)
-  Flonase: Over-the-counter medication.  2 sprays each nostril in the morning -Would change taking her Zyrtec at night -Saline nasal rinse or Nettie pot-like product to help flush the sinus passages -I Profen or Tylenol for pain -Push fluids -If not feeling better in 2-3 days, can start the antibiotic. -Azithromycin:Take 2 tablets by mouth the first day followed by one tablet daily for next 4 days.

## 2020-05-18 NOTE — ED Provider Notes (Signed)
[facility]    CSN: [id number] Arrival date & time: 05/18/20  1023      History   Chief Complaint Chief Complaint  Patient presents with  . Facial Pain    HPI Rebecca Morris is a 74 y.o. female.   Patient is a 74 year old female who presents with complaint of sinus infection since about Friday afternoon.  Today is Monday.  Patient states she gets these in the spring and the summer.  Patient reports sneezing, runny nose, headache that is constant, and fatigue.  Patient denies any ear symptoms, cough, sore throat, chest pain or shortness of breath.  She states she is taken Tylenol.  She also takes Zyrtec daily and has been observed for several months.  She allergic to Cipro and Macrodantin.     Past Medical History:  Diagnosis Date  . Anxiety   . Diverticulosis   . Hyperlipidemia     There are no problems to display for this patient.   Past Surgical History:  Procedure Laterality Date  . ABDOMINAL HYSTERECTOMY    . APPENDECTOMY    . BREAST CYST ASPIRATION Left 1988   asp cyst  . COLONOSCOPY    . COLONOSCOPY WITH PROPOFOL N/A 09/14/2017   Procedure: COLONOSCOPY WITH PROPOFOL;  Surgeon: Jonathon Bellows, MD;  Location: [facility] ENDOSCOPY;  Service: Gastroenterology;  Laterality: N/A;  . COLONOSCOPY WITH PROPOFOL N/A 09/23/2019   Procedure: COLONOSCOPY WITH PROPOFOL;  Surgeon: Jonathon Bellows, MD;  Location: [facility] ENDOSCOPY;  Service: Gastroenterology;  Laterality: N/A;  . COLONOSCOPY WITH PROPOFOL N/A 04/06/2020   Procedure: COLONOSCOPY WITH PROPOFOL;  Surgeon: Jonathon Bellows, MD;  Location: [facility] ENDOSCOPY;  Service: Gastroenterology;  Laterality: N/A;    OB History   No obstetric history on file.      Home Medications    Prior to Admission medications   Medication Sig Start Date End Date Taking? Authorizing Provider  atorvastatin (LIPITOR) 20 MG tablet Take 20 mg by mouth daily at 6 PM.  06/23/17   [provider]  azelastine (ASTELIN) 0.1 % nasal spray Place  1 spray into both nostrils 2 (two) times daily. 01/29/20   [provider]  azithromycin (ZITHROMAX Z-PAK) 250 MG tablet Take 2 tablets by mouth the first day followed by one tablet daily for next 4 days. 05/18/20   Luvenia Redden, PA-C  dicyclomine (BENTYL) 10 MG capsule Take 1 capsule (10 mg total) by mouth 4 (four) times daily -  before meals and at bedtime. 04/07/20   Jonathon Bellows, MD  estradiol (ESTRACE) 1 MG tablet Take 1 mg by mouth daily. 07/20/17   [provider]  FLUoxetine (PROZAC) 20 MG tablet Take 20 mg by mouth daily. 06/09/17   [provider]  montelukast (SINGULAIR) 10 MG tablet Take 10 mg by mouth at bedtime. 08/30/19   [provider]  Multiple Vitamin (MULTIVITAMIN) tablet Take 1 tablet by mouth daily.    [provider]  cetirizine (ZYRTEC) 10 MG tablet Take 10 mg by mouth daily.   03/05/20  [provider]    Family History Family History  Problem Relation Age of Onset  . Breast cancer Mother 63  . Hypertension Mother   . Lymphoma Father     Social History Social History   Tobacco Use  . Smoking status: Never Smoker  . Smokeless tobacco: Never Used  Vaping Use  . Vaping Use: Never used  Substance Use Topics  . Alcohol use: No  . Drug use: No  Allergies   Cipro [ciprofloxacin-ciproflox hcl er] and Macrodantin [nitrofurantoin macrocrystal]   Review of Systems Review of Systems as noted above HPI.  Of systems reviewed and found be negative   Physical Exam Triage Vital Signs ED Triage Vitals  Enc Vitals Group     BP 05/18/20 1055 125/64     Pulse Rate 05/18/20 1055 89     Resp 05/18/20 1055 16     Temp 05/18/20 1055 98.1 F (36.7 C)     Temp Source 05/18/20 1055 Oral     SpO2 05/18/20 1055 99 %     Weight 05/18/20 1054 137 lb (62.1 kg)     Height 05/18/20 1054 5\' 2"  (1.575 m)     Head Circumference --      Peak Flow --      Pain Score 05/18/20 1054 1     Pain Loc --      Pain Edu? --       Excl. in GC? --    No data found.  Updated Vital Signs BP 125/64 (BP Location: Left Arm)   Pulse 89   Temp 98.1 F (36.7 C) (Oral)   Resp 16   Ht 5\' 2"  (1.575 m)   Wt 137 lb (62.1 kg)   SpO2 99%   BMI 25.06 kg/m   Physical Exam Constitutional:      Appearance: Normal appearance. She is not ill-appearing or toxic-appearing.  HENT:     Right Ear: Tympanic membrane and ear canal normal.     Left Ear: Tympanic membrane and ear canal normal.     Nose: Congestion present.     Right Sinus: No maxillary sinus tenderness or frontal sinus tenderness.     Left Sinus: No maxillary sinus tenderness or frontal sinus tenderness.     Mouth/Throat:     Mouth: Mucous membranes are moist.     Pharynx: Oropharynx is clear.     Tonsils: No tonsillar exudate or tonsillar abscesses. 0 on the right. 0 on the left.  Eyes:     Conjunctiva/sclera: Conjunctivae normal.     Pupils: Pupils are equal, round, and reactive to light.  Cardiovascular:     Rate and Rhythm: Normal rate and regular rhythm.     Pulses: Normal pulses.     Heart sounds: Normal heart sounds.  Pulmonary:     Effort: Pulmonary effort is normal. No respiratory distress.     Breath sounds: Normal breath sounds. No wheezing.  Musculoskeletal:     Cervical back: Normal range of motion.  Skin:    General: Skin is dry.     Capillary Refill: Capillary refill takes less than 2 seconds.  Neurological:     General: No focal deficit present.     Mental Status: She is alert and oriented to person, place, and time.      UC Treatments / Results  Labs (all labs ordered are listed, but only abnormal results are displayed) Labs Reviewed - No data to display  EKG   Radiology No results found.  Procedures Procedures (including critical care time)  Medications Ordered in UC Medications - No data to display  Initial Impression / Assessment and Plan / UC Course  I have reviewed the triage vital signs and the nursing  notes.  Pertinent labs & imaging results that were available during my care of the patient were reviewed by me and considered in my medical decision making (see chart for details).     Exam interview as  above.  No signs of active infection.  Noted nasal congestion with frequent sniffling.  Nasal sounding voice.  Given instructions on recommendations as below for the current congestion.  Patient states she frequently has problems with sinus infections so we will go ahead and give her a prescription that she can start in 2 to 3 days should she not feel any better or should her symptoms worsen. Final Clinical Impressions(s) / UC Diagnoses   Final diagnoses:  Sinus congestion  Sinus headache     Discharge Instructions     -Flonase: Over-the-counter medication.  2 sprays each nostril in the morning -Would change taking her Zyrtec at night -Saline nasal rinse or Nettie pot-like product to help flush the sinus passages -I Profen or Tylenol for pain -Push fluids -If not feeling better in 2-3 days, can start the antibiotic. -Azithromycin:Take 2 tablets by mouth the first day followed by one tablet daily for next 4 days.     ED Prescriptions    Medication Sig Dispense Auth. Provider   azithromycin (ZITHROMAX Z-PAK) 250 MG tablet Take 2 tablets by mouth the first day followed by one tablet daily for next 4 days. 6 tablet Candis Schatz, PA-C     PDMP not reviewed this encounter.   Candis Schatz, PA-C 05/18/20 1206

## 2020-05-18 NOTE — ED Triage Notes (Signed)
Pt reports "I have a sinus infection."  Cough, fatigue, sneezing, frontal headache.

## 2020-08-06 ENCOUNTER — Other Ambulatory Visit: Payer: Self-pay | Admitting: Gastroenterology

## 2020-08-06 DIAGNOSIS — R1032 Left lower quadrant pain: Secondary | ICD-10-CM

## 2020-08-31 ENCOUNTER — Ambulatory Visit
Admission: EM | Admit: 2020-08-31 | Discharge: 2020-08-31 | Disposition: A | Discharge status: Home / Self Care | Payer: Medicare PPO | Attending: Family Medicine | Admitting: Family Medicine

## 2020-08-31 ENCOUNTER — Other Ambulatory Visit: Payer: Self-pay

## 2020-08-31 DIAGNOSIS — R067 Sneezing: Secondary | ICD-10-CM | POA: Diagnosis not present

## 2020-08-31 DIAGNOSIS — R5383 Other fatigue: Secondary | ICD-10-CM | POA: Insufficient documentation

## 2020-08-31 DIAGNOSIS — J069 Acute upper respiratory infection, unspecified: Secondary | ICD-10-CM | POA: Insufficient documentation

## 2020-08-31 DIAGNOSIS — E785 Hyperlipidemia, unspecified: Secondary | ICD-10-CM | POA: Insufficient documentation

## 2020-08-31 DIAGNOSIS — F419 Anxiety disorder, unspecified: Secondary | ICD-10-CM | POA: Diagnosis not present

## 2020-08-31 DIAGNOSIS — J029 Acute pharyngitis, unspecified: Secondary | ICD-10-CM | POA: Diagnosis present

## 2020-08-31 DIAGNOSIS — U071 COVID-19: Secondary | ICD-10-CM | POA: Insufficient documentation

## 2020-08-31 DIAGNOSIS — Z79899 Other long term (current) drug therapy: Secondary | ICD-10-CM | POA: Insufficient documentation

## 2020-08-31 DIAGNOSIS — R059 Cough, unspecified: Secondary | ICD-10-CM | POA: Diagnosis not present

## 2020-08-31 LAB — GROUP A STREP BY PCR: Group A Strep by PCR: NOT DETECTED

## 2020-08-31 MED ORDER — BENZONATATE 200 MG PO CAPS: 200.0000 mg | ORAL_CAPSULE | Freq: Three times a day (TID) | ORAL | 0 refills | Status: DC | PRN

## 2020-08-31 MED ORDER — IPRATROPIUM BROMIDE 0.06 % NA SOLN: 2.0000 | Freq: Four times a day (QID) | NASAL | 0 refills | Status: DC | PRN

## 2020-08-31 MED ORDER — LIDOCAINE VISCOUS HCL 2 % MT SOLN: OROMUCOSAL | 0 refills | Status: DC

## 2020-08-31 NOTE — ED Provider Notes (Signed)
[facility]    CSN: [id number] Arrival date & time: 08/31/20  0945      History   Chief Complaint Chief Complaint  Patient presents with  . Sore Throat  . Cough  . Fatigue   HPI  74 year old female presents the above complaints.  Symptoms started on Friday.  She reports coughing and then subsequent development of sore throat, sneezing, and fatigue.  She has had no fever.  No reported sick contacts.  No relieving factors.  Denies significant pain at this time.  Most troublesome symptom is sore throat.  No other complaints at this time.  Past Medical History:  Diagnosis Date  . Anxiety   . Diverticulosis   . Hyperlipidemia    Past Surgical History:  Procedure Laterality Date  . ABDOMINAL HYSTERECTOMY    . APPENDECTOMY    . BREAST CYST ASPIRATION Left 1988   asp cyst  . COLONOSCOPY    . COLONOSCOPY WITH PROPOFOL N/A 09/14/2017   Procedure: COLONOSCOPY WITH PROPOFOL;  Surgeon: [name], MD;  Location: [facility] ENDOSCOPY;  Service: Gastroenterology;  Laterality: N/A;  . COLONOSCOPY WITH PROPOFOL N/A 09/23/2019   Procedure: COLONOSCOPY WITH PROPOFOL;  Surgeon: [name], MD;  Location: [facility] ENDOSCOPY;  Service: Gastroenterology;  Laterality: N/A;  . COLONOSCOPY WITH PROPOFOL N/A 04/06/2020   Procedure: COLONOSCOPY WITH PROPOFOL;  Surgeon: [name], MD;  Location: [facility] ENDOSCOPY;  Service: Gastroenterology;  Laterality: N/A;    OB History   No obstetric history on file.      Home Medications    Prior to Admission medications   Medication Sig Start Date End Date Taking? Authorizing Provider  atorvastatin (LIPITOR) 20 MG tablet Take 20 mg by mouth daily at 6 PM.  06/23/17  Yes [provider]  azelastine (ASTELIN) 0.1 % nasal spray Place 1 spray into both nostrils 2 (two) times daily. 01/29/20  Yes [provider]  dicyclomine (BENTYL) 10 MG capsule TAKE 1 CAPSULE (10 MG TOTAL) BY MOUTH 4 (FOUR) TIMES DAILY - BEFORE MEALS AND AT BEDTIME.  08/06/20  Yes [name], MD  estradiol (ESTRACE) 1 MG tablet Take 1 mg by mouth daily. 07/20/17  Yes [provider]  FLUoxetine (PROZAC) 20 MG tablet Take 20 mg by mouth daily. 06/09/17  Yes [provider]  montelukast (SINGULAIR) 10 MG tablet Take 10 mg by mouth at bedtime. 08/30/19  Yes [provider]  Multiple Vitamin (MULTIVITAMIN) tablet Take 1 tablet by mouth daily.   Yes [provider]  benzonatate (TESSALON) 200 MG capsule Take 1 capsule (200 mg total) by mouth 3 (three) times daily as needed for cough. 08/31/20   Everlene Other G, DO  ipratropium (ATROVENT) 0.06 % nasal spray Place 2 sprays into both nostrils 4 (four) times daily as needed for rhinitis. 08/31/20   Tommie Sams, DO  lidocaine (XYLOCAINE) 2 % solution Gargle 15 mL every 3 hours as needed for sore throat. May swallow if desired. 08/31/20   Tommie Sams, DO  cetirizine (ZYRTEC) 10 MG tablet Take 10 mg by mouth daily.   03/05/20  [provider]    Family History Family History  Problem Relation Age of Onset  . Breast cancer Mother 70  . Hypertension Mother   . Lymphoma Father     Social History Social History   Tobacco Use  . Smoking status: Never Smoker  . Smokeless tobacco: Never Used  Vaping Use  . Vaping Use: Never used  Substance Use Topics  .  Alcohol use: No  . Drug use: No     Allergies   Cipro [ciprofloxacin-ciproflox hcl er] and Macrodantin [nitrofurantoin macrocrystal]   Review of Systems Review of Systems Per HPI  Physical Exam Triage Vital Signs ED Triage Vitals  Enc Vitals Group     BP 08/31/20 1022 (!) 111/91     Pulse Rate 08/31/20 1022 88     Resp 08/31/20 1022 18     Temp 08/31/20 1022 97.9 F (36.6 C)     Temp Source 08/31/20 1022 Oral     SpO2 08/31/20 1022 99 %     Weight 08/31/20 1024 139 lb (63 kg)     Height 08/31/20 1024 5\' 3"  (1.6 m)     Head Circumference --      Peak Flow --      Pain Score 08/31/20 1024 0     Pain Loc --       Pain Edu? --      Excl. in GC? --    No data found.  Updated Vital Signs BP (!) 111/91 (BP Location: Right Arm)   Pulse 88   Temp 97.9 F (36.6 C) (Oral)   Resp 18   Ht 5\' 3"  (1.6 m)   Wt 63 kg   SpO2 99%   BMI 24.62 kg/m   Visual Acuity Right Eye Distance:   Left Eye Distance:   Bilateral Distance:    Right Eye Near:   Left Eye Near:    Bilateral Near:     Physical Exam Vitals and nursing note reviewed.  Constitutional:      General: She is not in acute distress.    Appearance: Normal appearance. She is not ill-appearing.  HENT:     Head: Normocephalic and atraumatic.     Right Ear: Tympanic membrane normal.     Left Ear: Tympanic membrane normal.     Mouth/Throat:     Pharynx: No oropharyngeal exudate or posterior oropharyngeal erythema.  Eyes:     General:        Right eye: No discharge.        Left eye: No discharge.     Conjunctiva/sclera: Conjunctivae normal.  Cardiovascular:     Rate and Rhythm: Normal rate and regular rhythm.     Heart sounds: No murmur heard.   Pulmonary:     Effort: Pulmonary effort is normal.     Breath sounds: Normal breath sounds. No wheezing or rales.  Neurological:     Mental Status: She is alert.  Psychiatric:        Mood and Affect: Mood normal.        Behavior: Behavior normal.    UC Treatments / Results  Labs (all labs ordered are listed, but only abnormal results are displayed) Labs Reviewed  GROUP A STREP BY PCR  SARS CORONAVIRUS 2 (TAT 6-24 HRS)    EKG   Radiology No results found.  Procedures Procedures (including critical care time)  Medications Ordered in UC Medications - No data to display  Initial Impression / Assessment and Plan / UC Course  I have reviewed the triage vital signs and the nursing notes.  Pertinent labs & imaging results that were available during my care of the patient were reviewed by me and considered in my medical decision making (see chart for details).     74 year old female presents with a viral respiratory infection.  Awaiting Covid test results.  Strep negative today.  Viscous lidocaine, Tessalon Perles, and  Atrovent nasal spray as prescribed.  Supportive care.  Final Clinical Impressions(s) / UC Diagnoses   Final diagnoses:  Viral URI with cough     Discharge Instructions     Medications as directed.  Check my chart for COVID result.  Take care  Dr. Adriana Simas    ED Prescriptions    Medication Sig Dispense Auth. Provider   lidocaine (XYLOCAINE) 2 % solution Gargle 15 mL every 3 hours as needed for sore throat. May swallow if desired. 200 mL [place] Bojanowski G, DO   benzonatate (TESSALON) 200 MG capsule Take 1 capsule (200 mg total) by mouth 3 (three) times daily as needed for cough. 30 capsule Keller Mikels G, DO   ipratropium (ATROVENT) 0.06 % nasal spray Place 2 sprays into both nostrils 4 (four) times daily as needed for rhinitis. 15 mL Tommie Sams, DO     PDMP not reviewed this encounter.   Tommie Sams, [place] 08/31/20 1434

## 2020-08-31 NOTE — Discharge Instructions (Signed)
Medications as directed.  Check my chart for COVID result.  Take care  Dr. Adriana Simas

## 2020-08-31 NOTE — ED Triage Notes (Signed)
Patient in today w/ c/o fatigue, sore throat and cough x approx. 3 days. Patient denies loss of taste/smell, fever.  Patient states she has been vaccinated against COVID-19 and also has received her flu vaccine as well.

## 2020-09-01 LAB — SARS CORONAVIRUS 2 (TAT 6-24 HRS): SARS Coronavirus 2: POSITIVE — AB

## 2020-09-02 ENCOUNTER — Telehealth: Payer: Self-pay | Admitting: Infectious Diseases

## 2020-09-02 NOTE — Telephone Encounter (Signed)
Called to Discuss with patient about Covid symptoms and the use of the monoclonal antibody infusion for those with mild to moderate Covid symptoms and at a high risk of hospitalization.     Pt appears to qualify for this infusion due to co-morbid conditions and/or a member of an at-risk group in accordance with the FDA Emergency Use Authorization.    Unable to reach the patient - LVM.    Rexene Alberts, MSN, NP-C [facility]  [email or web address] Pager: [phone] Office: [phone] [facility] Main Line: [phone]

## 2020-11-06 IMAGING — CT CT ABD-PELV W/ CM
2 of 5 series · 16 of 46 positions shown, 18 images · IV contrast (APPLIED)
Comparison: 07/24/2019

CLINICAL DATA: Abdominal pain, diverticulitis suspected

EXAM:
CT ABDOMEN AND PELVIS WITH CONTRAST
TECHNIQUE: Multidetector CT imaging of the abdomen and pelvis was performed
using the standard protocol following bolus administration of
intravenous contrast.
CONTRAST:  100mL OMNIPAQUE IOHEXOL 300 MG/ML  SOLN

[Series 2: routine abd/pel with · axial · 0.64mm/px · z∈[-463,-78]mm · 13 of 87 slices shown, 15 images]
[im 5/87  soft-tissue]
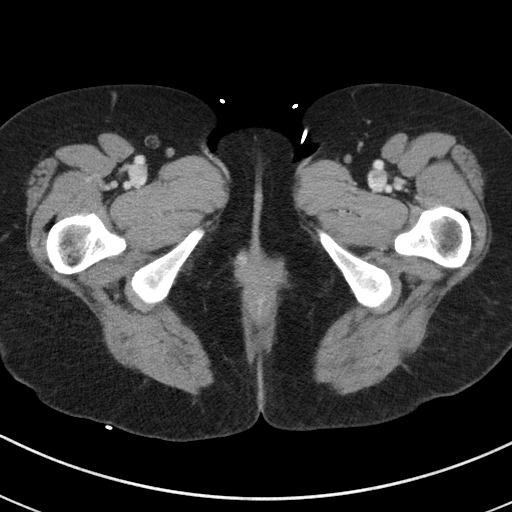
[im 5/87  bone]
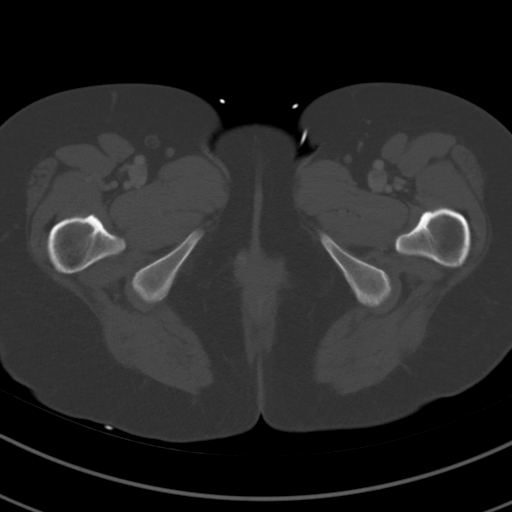
[im 10/87  soft-tissue]
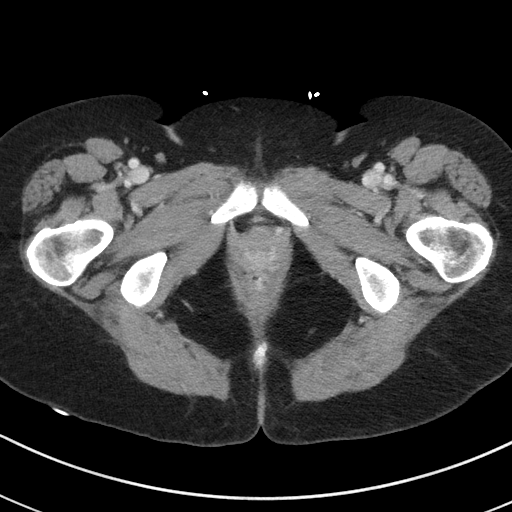
[im 20/87  soft-tissue]
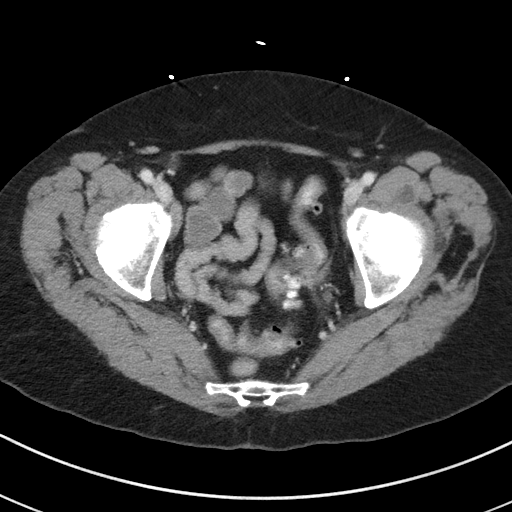
[im 24/87  soft-tissue]
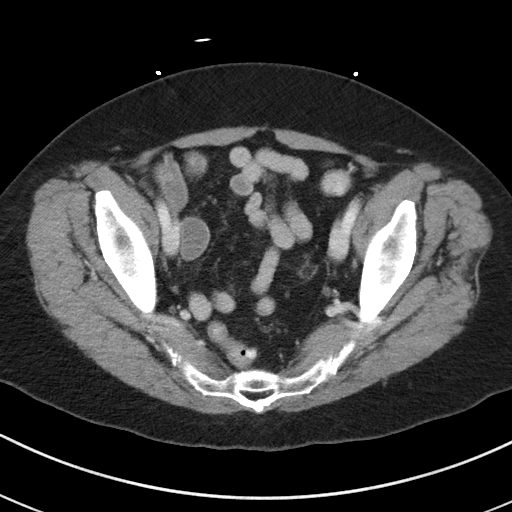
[im 29/87  soft-tissue]
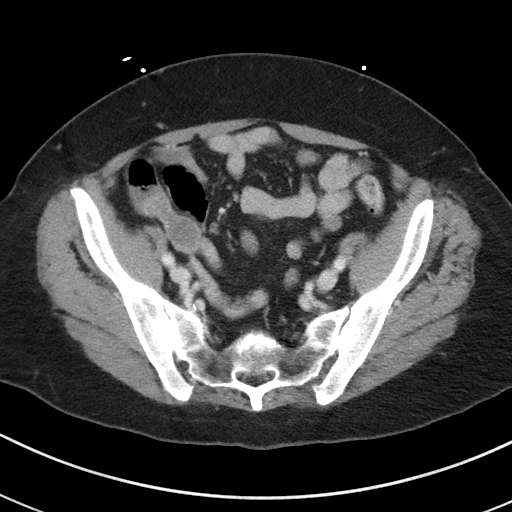
[im 39/87  soft-tissue]
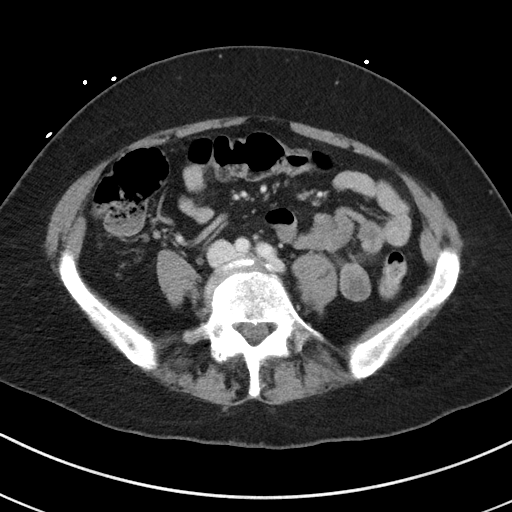
[im 44/87  soft-tissue]
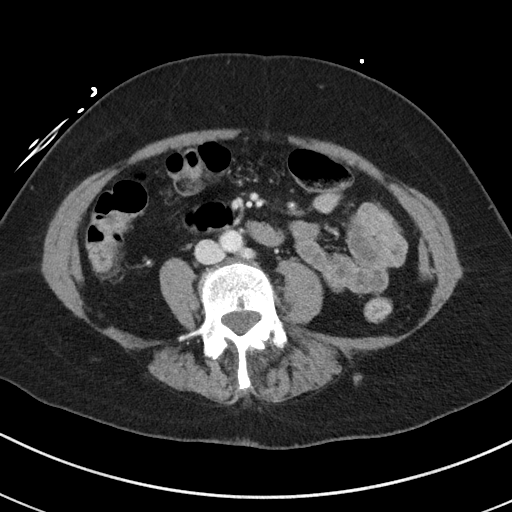
[im 48/87  soft-tissue]
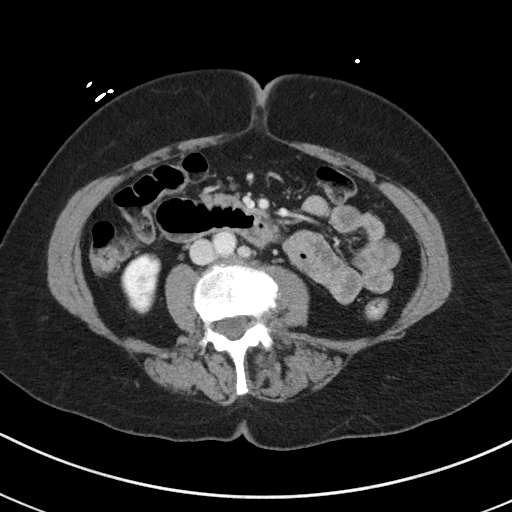
[im 58/87  soft-tissue]
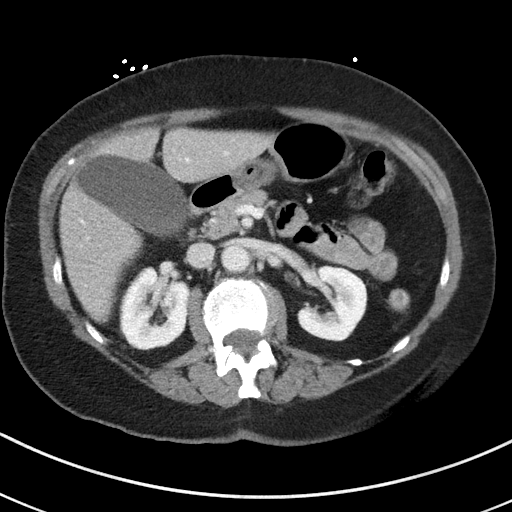
[im 58/87  bone]
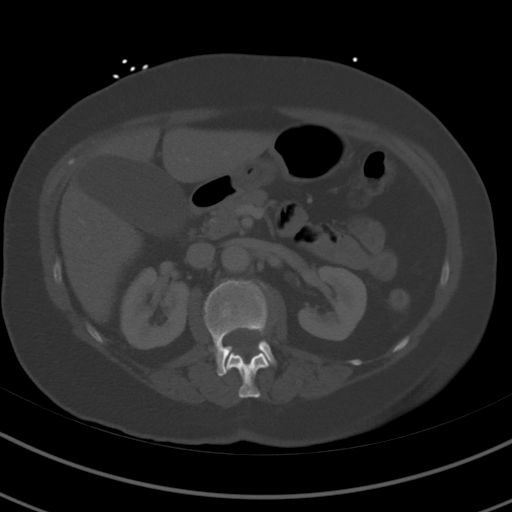
[im 63/87  soft-tissue]
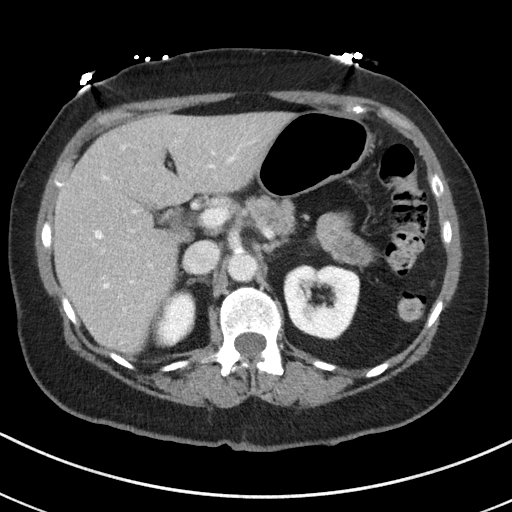
[im 67/87  soft-tissue]
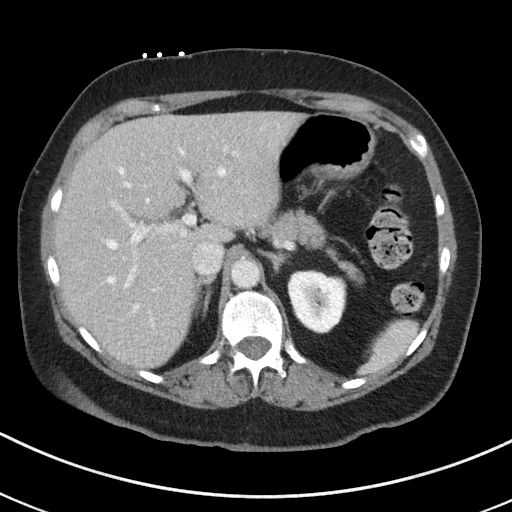
[im 77/87  soft-tissue]
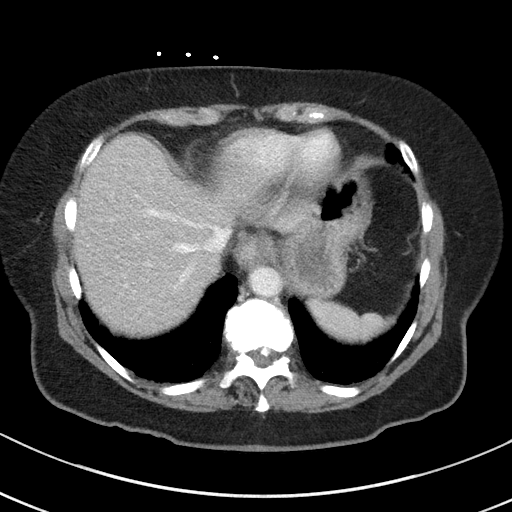
[im 82/87  soft-tissue]
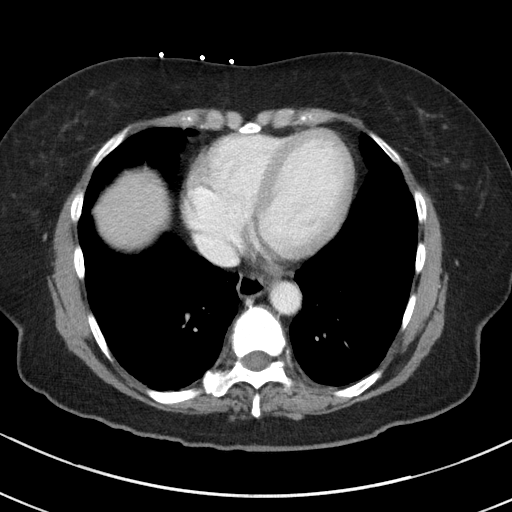

[Series 5: coronal st · coronal · 0.78mm/px · 3 of 88 slices shown]
[im 30/88  soft-tissue]
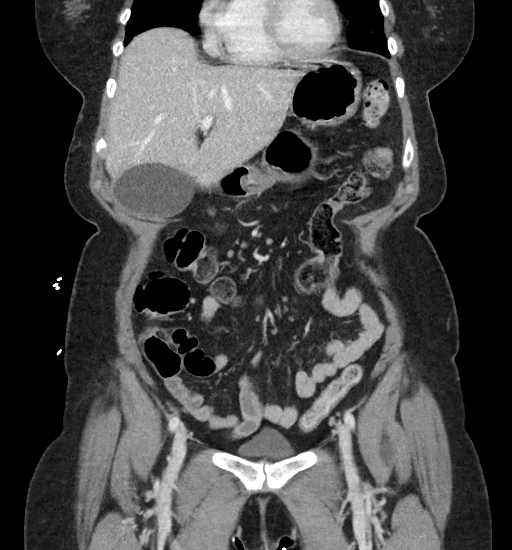
[im 39/88  soft-tissue]
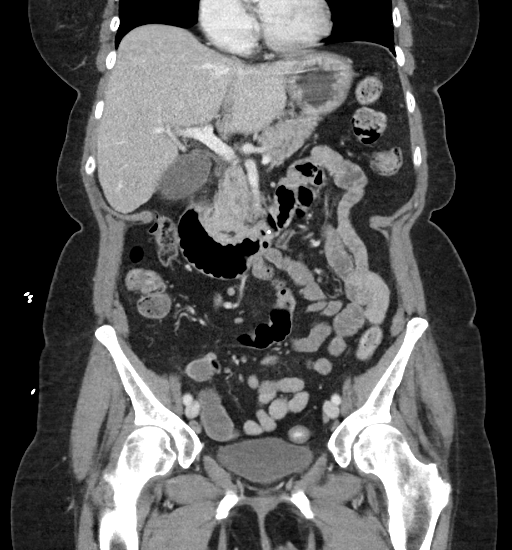
[im 49/88  soft-tissue]
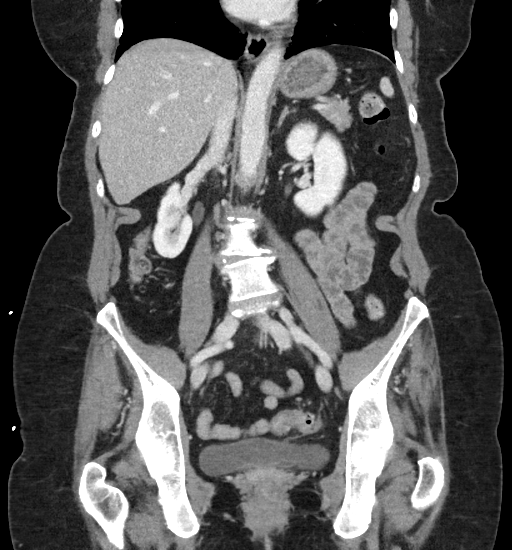

[16 of 46 positions shown; findings below may reference images not displayed]

FINDINGS: Lower chest: Lung bases are clear. No signs of pleural or
pericardial effusion. Heart is incompletely imaged.

Hepatobiliary: Liver is normal. Gallbladder is moderately distended
with suggestion of sludge in the dependent portion. No
pericholecystic stranding. No biliary ductal dilation.

Pancreas: Unremarkable. No pancreatic ductal dilatation or
surrounding inflammatory changes.

Spleen: Normal in size without focal abnormality.

Adrenals/Urinary Tract: Normal adrenal glands.

Symmetric enhancement of the bilateral kidneys without
hydronephrosis or nephrolithiasis.

Stomach/Bowel: Small hiatal hernia. Small bowel is not. Signs of
colonic diverticulosis with mild stranding adjacent to the lateral
wall of the sigmoid colon best seen on image 67 of series 2,
findings are less severe than on the study Saturday June, 2019.

Vascular/Lymphatic: Signs of duplicated IVC. Scattered
atherosclerosis. No evidence of aneurysm.

No signs of adenopathy.

Reproductive: Post hysterectomy.

Other: Small fat containing umbilical hernia.

Musculoskeletal: Spinal degenerative changes without acute or
destructive bone findings. Grade 1 anterolisthesis of L4 on L5.
Findings are not changed compared to prior exam.
IMPRESSION: 1. Mild uncomplicated diverticulitis, findings are less severe when
compared to the previous exam of Saturday June, 2019. Follow-up imaging
may be helpful, as inflammatory changes are in close proximity to
the urinary bladder.
2. Signs of duplicated IVC.
3. Small hiatal hernia.

Aortic Atherosclerosis ([id number]-[id number].O).

## 2021-01-22 ENCOUNTER — Other Ambulatory Visit: Payer: Self-pay | Admitting: Certified Nurse Midwife

## 2021-01-22 DIAGNOSIS — Z1231 Encounter for screening mammogram for malignant neoplasm of breast: Secondary | ICD-10-CM

## 2021-01-25 ENCOUNTER — Other Ambulatory Visit: Payer: Self-pay | Admitting: Gastroenterology

## 2021-01-25 DIAGNOSIS — R1032 Left lower quadrant pain: Secondary | ICD-10-CM

## 2021-04-01 ENCOUNTER — Ambulatory Visit
Admission: RE | Admit: 2021-04-01 | Discharge: 2021-04-01 | Disposition: A | Discharge status: Home / Self Care | Payer: Medicare PPO | Source: Ambulatory Visit | Attending: Certified Nurse Midwife | Admitting: Certified Nurse Midwife

## 2021-04-01 ENCOUNTER — Other Ambulatory Visit: Payer: Self-pay

## 2021-04-01 DIAGNOSIS — Z1231 Encounter for screening mammogram for malignant neoplasm of breast: Secondary | ICD-10-CM | POA: Insufficient documentation

## 2021-07-22 ENCOUNTER — Ambulatory Visit
Admission: EM | Admit: 2021-07-22 | Discharge: 2021-07-22 | Disposition: A | Discharge status: Home / Self Care | Payer: Medicare PPO | Attending: Physician Assistant | Admitting: Physician Assistant

## 2021-07-22 ENCOUNTER — Encounter: Payer: Self-pay | Admitting: Emergency Medicine

## 2021-07-22 ENCOUNTER — Other Ambulatory Visit: Payer: Self-pay

## 2021-07-22 DIAGNOSIS — L259 Unspecified contact dermatitis, unspecified cause: Secondary | ICD-10-CM | POA: Diagnosis not present

## 2021-07-22 MED ORDER — HYDROXYZINE HCL 25 MG PO TABS
25.0000 mg | ORAL_TABLET | Freq: Four times a day (QID) | ORAL | 0 refills | Status: AC | PRN
Start: 2021-07-22 — End: 2021-07-27

## 2021-07-22 MED ORDER — TRIAMCINOLONE ACETONIDE 0.5 % EX OINT
1.0000 | TOPICAL_OINTMENT | Freq: Two times a day (BID) | CUTANEOUS | 0 refills | Status: AC
Start: 2021-07-22 — End: 2021-07-29

## 2021-07-22 NOTE — Discharge Instructions (Addendum)
Your rash is consistent with some sort of poison ivy/oak or other poisonous plant dermatitis.  I have sent a topical corticosteroid for you to use twice a day.  Keep the area clean.  You can also purchase over-the-counter Tecnu to help kill the oils.  Try not to scratch.  I have sent hydroxyzine to help with itching but it can make you little sleepy take it during the day.  Take it at home and side effects you.  If you need to take to tablets at night that is fine but be careful getting in and out of bed as it can make you drowsy.  You should follow-up for any worsening of your condition or signs of infection.

## 2021-07-22 NOTE — ED Provider Notes (Signed)
[facility]    CSN: [id number] Arrival date & time: 07/22/21  1032      History   Chief Complaint Chief Complaint  Patient presents with   Rash    HPI Rebecca Morris is a 75 y.o. female presenting for approximately 1 week history of vesicular, erythematous and pruritic rash of bilateral ankles.  Patient is unsure if she came into contact with any poisonous plants but she says [place] out [place].  She has tried multiple over-the-counter medications without improvement in her symptoms.  Patient says that itching is keeping her up at night.  She denies any rash elsewhere.  No facial swelling and denies any anaphylactic symptoms.  No other complaints.  HPI  Past Medical History:  Diagnosis Date   Anxiety    Diverticulosis    Hyperlipidemia     There are no problems to display for this patient.   Past Surgical History:  Procedure Laterality Date   ABDOMINAL HYSTERECTOMY     APPENDECTOMY     BREAST CYST ASPIRATION Left 1988   asp cyst   COLONOSCOPY     COLONOSCOPY WITH PROPOFOL N/A 09/14/2017   Procedure: COLONOSCOPY WITH PROPOFOL;  Surgeon: [name], MD;  Location: [facility] ENDOSCOPY;  Service: Gastroenterology;  Laterality: N/A;   COLONOSCOPY WITH PROPOFOL N/A 09/23/2019   Procedure: COLONOSCOPY WITH PROPOFOL;  Surgeon: [name], MD;  Location: [facility] ENDOSCOPY;  Service: Gastroenterology;  Laterality: N/A;   COLONOSCOPY WITH PROPOFOL N/A 04/06/2020   Procedure: COLONOSCOPY WITH PROPOFOL;  Surgeon: [name], MD;  Location: [facility] ENDOSCOPY;  Service: Gastroenterology;  Laterality: N/A;    OB History   No obstetric history on file.      Home Medications    Prior to Admission medications   Medication Sig Start Date End Date Taking? Authorizing Provider  atorvastatin (LIPITOR) 20 MG tablet Take 20 mg by mouth daily at 6 PM.  06/23/17  Yes [provider]  dicyclomine (BENTYL) 10 MG capsule TAKE 1 CAPSULE (10 MG TOTAL) BY MOUTH 4 (FOUR)  TIMES DAILY - BEFORE MEALS AND AT BEDTIME. 01/25/21  Yes [name], MD  estradiol (ESTRACE) 1 MG tablet Take 1 mg by mouth daily. 07/20/17  Yes [provider]  FLUoxetine (PROZAC) 20 MG tablet Take 20 mg by mouth daily. 06/09/17  Yes [provider]  hydrOXYzine (ATARAX/VISTARIL) 25 MG tablet Take 1 tablet (25 mg total) by mouth every 6 (six) hours as needed for up to 5 days for itching. 07/22/21 07/27/21 Yes Shirlee Latch, PA-C  Multiple Vitamin (MULTIVITAMIN) tablet Take 1 tablet by mouth daily.   Yes [provider]  triamcinolone ointment (KENALOG) 0.5 % Apply 1 application topically 2 (two) times daily for 7 days. 07/22/21 07/29/21 Yes Eusebio Friendly B, PA-C  montelukast (SINGULAIR) 10 MG tablet Take 10 mg by mouth at bedtime. 08/30/19   [provider]  cetirizine (ZYRTEC) 10 MG tablet Take 10 mg by mouth daily.   03/05/20  [provider]    Family History Family History  Problem Relation Age of Onset   Breast cancer Mother 28   Hypertension Mother    Lymphoma Father     Social History Social History   Tobacco Use   Smoking status: Never   Smokeless tobacco: Never  Vaping Use   Vaping Use: Never used  Substance Use Topics   Alcohol use: No   Drug use: No     Allergies   Cipro [ciprofloxacin-ciproflox  hcl er] and Macrodantin [nitrofurantoin macrocrystal]   Review of Systems Review of Systems  Constitutional:  Negative for fatigue and fever.  HENT:  Negative for facial swelling.   Respiratory:  Negative for shortness of breath.   Musculoskeletal:  Negative for arthralgias, joint swelling and myalgias.  Skin:  Positive for rash.    Physical Exam Triage Vital Signs ED Triage Vitals  Enc Vitals Group     BP 07/22/21 1115 140/75     Pulse Rate 07/22/21 1115 80     Resp 07/22/21 1115 18     Temp 07/22/21 1115 98.2 F (36.8 C)     Temp Source 07/22/21 1115 Oral     SpO2 07/22/21 1115 97 %     Weight 07/22/21 1112 138 lb 14.2  oz (63 kg)     Height 07/22/21 1112 5\' 3"  (1.6 m)     Head Circumference --      Peak Flow --      Pain Score 07/22/21 1112 0     Pain Loc --      Pain Edu? --      Excl. in GC? --    No data found.  Updated Vital Signs BP 140/75 (BP Location: Right Arm)   Pulse 80   Temp 98.2 F (36.8 C) (Oral)   Resp 18   Ht 5\' 3"  (1.6 m)   Wt 138 lb 14.2 oz (63 kg)   SpO2 97%   BMI 24.60 kg/m      Physical Exam Vitals and nursing note reviewed.  Constitutional:      General: She is not in acute distress.    Appearance: Normal appearance. She is not ill-appearing or toxic-appearing.  HENT:     Head: Normocephalic and atraumatic.  Eyes:     General: No scleral icterus.       Right eye: No discharge.        Left eye: No discharge.     Conjunctiva/sclera: Conjunctivae normal.  Cardiovascular:     Rate and Rhythm: Normal rate and regular rhythm.     Heart sounds: Normal heart sounds.  Pulmonary:     Effort: Pulmonary effort is normal. No respiratory distress.     Breath sounds: Normal breath sounds.  Musculoskeletal:     Cervical back: Neck supple.  Skin:    General: Skin is dry.     Findings: Rash (there are multiple areas of patchy vesicles on erythematous base of bilateral ankles) present.  Neurological:     General: No focal deficit present.     Mental Status: She is alert. Mental status is at baseline.     Motor: No weakness.     Gait: Gait normal.  Psychiatric:        Mood and Affect: Mood normal.        Behavior: Behavior normal.        Thought Content: Thought content normal.     UC Treatments / Results  Labs (all labs ordered are listed, but only abnormal results are displayed) Labs Reviewed - No data to display  EKG   Radiology No results found.  Procedures Procedures (including critical care time)  Medications Ordered in UC Medications - No data to display  Initial Impression / Assessment and Plan / UC Course  I have reviewed the triage vital signs  and the nursing notes.  Pertinent labs & imaging results that were available during my care of the patient were reviewed by me and considered in  my medical decision making (see chart for details).  75 year old female presenting for vesicular and pruritic rash of bilateral ankles x1 week.  Has not improved with over-the-counter products.  Rash is consistent with some sort of contact dermatitis to a poisonous plant.  Treating her at this time with topical triamcinolone.  Also sent hydroxyzine.  Advised her not to scratch.  Reviewed when to return for reevaluation.  Final Clinical Impressions(s) / UC Diagnoses   Final diagnoses:  Contact dermatitis, unspecified contact dermatitis type, unspecified trigger     Discharge Instructions      Your rash is consistent with some sort of poison ivy/oak or other poisonous plant dermatitis.  I have sent a topical corticosteroid for you to use twice a day.  Keep the area clean.  You can also purchase over-the-counter Tecnu to help kill the oils.  Try not to scratch.  I have sent hydroxyzine to help with itching but it can make you little sleepy take it during the day.  Take it at home and side effects you.  If you need to take to tablets at night that is fine but be careful getting in and out of bed as it can make you drowsy.  You should follow-up for any worsening of your condition or signs of infection.     ED Prescriptions     Medication Sig Dispense Auth. Provider   triamcinolone ointment (KENALOG) 0.5 % Apply 1 application topically 2 (two) times daily for 7 days. 30 g Eusebio Friendly B, PA-C   hydrOXYzine (ATARAX/VISTARIL) 25 MG tablet Take 1 tablet (25 mg total) by mouth every 6 (six) hours as needed for up to 5 days for itching. 20 tablet Gareth Morgan      PDMP not reviewed this encounter.   Shirlee Latch, PA-C 07/22/21 1201

## 2021-07-22 NOTE — ED Triage Notes (Signed)
Pt has red, raised itchy rash on bilateral lower legs. Started about week ago. She has used several OTC medication without relief.

## 2021-07-26 ENCOUNTER — Other Ambulatory Visit: Payer: Self-pay | Admitting: Gastroenterology

## 2021-07-26 DIAGNOSIS — R1032 Left lower quadrant pain: Secondary | ICD-10-CM

## 2021-07-27 NOTE — Telephone Encounter (Signed)
Patient will need an appointment for additional refills

## 2021-07-28 ENCOUNTER — Other Ambulatory Visit: Payer: Self-pay

## 2021-07-28 DIAGNOSIS — R1032 Left lower quadrant pain: Secondary | ICD-10-CM

## 2021-07-28 MED ORDER — DICYCLOMINE HCL 10 MG PO CAPS: 10.0000 mg | ORAL_CAPSULE | Freq: Three times a day (TID) | ORAL | 0 refills | Status: DC

## 2021-10-03 ENCOUNTER — Other Ambulatory Visit: Payer: Self-pay | Admitting: Gastroenterology

## 2021-10-03 DIAGNOSIS — R1032 Left lower quadrant pain: Secondary | ICD-10-CM

## 2022-01-09 ENCOUNTER — Other Ambulatory Visit: Payer: Self-pay | Admitting: Gastroenterology

## 2022-01-09 DIAGNOSIS — R1032 Left lower quadrant pain: Secondary | ICD-10-CM

## 2022-04-26 ENCOUNTER — Ambulatory Visit
Admission: EM | Admit: 2022-04-26 | Discharge: 2022-04-26 | Disposition: A | Discharge status: Home / Self Care | Payer: Medicare PPO | Attending: Internal Medicine | Admitting: Internal Medicine

## 2022-04-26 DIAGNOSIS — M5442 Lumbago with sciatica, left side: Secondary | ICD-10-CM

## 2022-04-26 MED ORDER — HYDROCODONE-ACETAMINOPHEN 5-325 MG PO TABS: 1.0000 | ORAL_TABLET | Freq: Four times a day (QID) | ORAL | 0 refills | Status: DC | PRN

## 2022-04-26 MED ORDER — NAPROXEN 500 MG PO TABS: 500.0000 mg | ORAL_TABLET | Freq: Two times a day (BID) | ORAL | 0 refills | Status: DC

## 2022-04-26 NOTE — Discharge Instructions (Signed)
Anticipate gradual improvement over the next 2-3 weeks in back pain.  Prescriptions for vicodin (pain medicine, to use sparingly) and naprosyn (anti inflammatory/pain reliever) were sent to the pharmacy.  Recheck or followup with your primary care provider, a sports medicine provider, or a spine specialist (orthopedics or neurosurgeon) if not improving as anticipated.  Physical therapy, to strengthen core muscles of torso, is helpful to decrease likelihood of future episodes of back pain.

## 2022-04-26 NOTE — ED Triage Notes (Signed)
Patient states "I pulled a muscle in my back about 3 weeks ago and im tired of it."

## 2022-04-26 NOTE — ED Provider Notes (Signed)
[facility]    CSN: [id number] Arrival date & time: 04/26/22  1030      History   Chief Complaint No chief complaint on file.   HPI Rebecca Morris is a 76 y.o. female.   Pulled muscle in back about 3 weeks ago, not resolving.  No trauma--didn't fall, no accident, wasn't struck.  Was lifting a heavy stand mixer from a lower cabinet to the counter and next day, had back pain.   No weakness or clumsiness of the legs, no loss of sensation, no change in bowel/bladder control.   Maybe one prior episode of back pain, about 9 years ago, was less severe.  Thinks she had a few vicodin and it resolved. Currently using heat wraps and taking tylenol.  Not sleeping at night--wakes up every couple hours.    HPI  Past Medical History:  Diagnosis Date   Anxiety    Diverticulosis    Hyperlipidemia     There are no problems to display for this patient.   Past Surgical History:  Procedure Laterality Date   ABDOMINAL HYSTERECTOMY     APPENDECTOMY     BREAST CYST ASPIRATION Left 1988   asp cyst   COLONOSCOPY     COLONOSCOPY WITH PROPOFOL N/A 09/14/2017   Procedure: COLONOSCOPY WITH PROPOFOL;  Surgeon: [name], MD;  Location: [facility] ENDOSCOPY;  Service: Gastroenterology;  Laterality: N/A;   COLONOSCOPY WITH PROPOFOL N/A 09/23/2019   Procedure: COLONOSCOPY WITH PROPOFOL;  Surgeon: [name], MD;  Location: [facility] ENDOSCOPY;  Service: Gastroenterology;  Laterality: N/A;   COLONOSCOPY WITH PROPOFOL N/A 04/06/2020   Procedure: COLONOSCOPY WITH PROPOFOL;  Surgeon: [name], MD;  Location: [facility] ENDOSCOPY;  Service: Gastroenterology;  Laterality: N/A;    OB History   No obstetric history on file.      Home Medications    Prior to Admission medications   Medication Sig Start Date End Date Taking? Authorizing Provider  atorvastatin (LIPITOR) 20 MG tablet Take 20 mg by mouth daily at 6 PM.  06/23/17  Yes [provider]  dicyclomine (BENTYL) 10 MG capsule TAKE 1  CAPSULE (10 MG TOTAL) BY MOUTH 4 (FOUR) TIMES DAILY - BEFORE MEALS AND AT BEDTIME. 01/10/22  Yes [name], MD  estradiol (ESTRACE) 1 MG tablet Take 1 mg by mouth daily. 07/20/17  Yes [provider]  FLUoxetine (PROZAC) 20 MG tablet Take 20 mg by mouth daily. 06/09/17  Yes [provider]  HYDROcodone-acetaminophen (NORCO/VICODIN) 5-325 MG tablet Take 1 tablet by mouth 4 (four) times daily as needed. 04/26/22  Yes Isa Rankin, MD  Multiple Vitamin (MULTIVITAMIN) tablet Take 1 tablet by mouth daily.   Yes [provider]  naproxen (NAPROSYN) 500 MG tablet Take 1 tablet (500 mg total) by mouth 2 (two) times daily. 04/26/22  Yes Isa Rankin, MD  cetirizine (ZYRTEC) 10 MG tablet Take 10 mg by mouth daily.   03/05/20  [provider]    Family History Family History  Problem Relation Age of Onset   Breast cancer Mother 73   Hypertension Mother    Lymphoma Father     Social History Social History   Tobacco Use   Smoking status: Never   Smokeless tobacco: Never  Vaping Use   Vaping Use: Never used  Substance Use Topics   Alcohol use: No   Drug use: No     Allergies   Cipro [ciprofloxacin-ciproflox hcl er] and Macrodantin [nitrofurantoin macrocrystal]   Review of Systems  Review of Systems   Physical Exam Triage Vital Signs ED Triage Vitals  Enc Vitals Group     BP 04/26/22 1120 132/64     Pulse Rate 04/26/22 1120 84     Resp 04/26/22 1120 20     Temp 04/26/22 1120 98.3 F (36.8 C)     Temp Source 04/26/22 1120 Oral     SpO2 04/26/22 1120 97 %     Weight 04/26/22 1118 130 lb (59 kg)     Height 04/26/22 1118 5\' 4"  (1.626 m)     Head Circumference --      Peak Flow --      Pain Score 04/26/22 1117 3     Pain Loc --      Pain Edu? --      Excl. in GC? --    No data found.  Updated Vital Signs BP 132/64 (BP Location: Left Arm)   Pulse 84   Temp 98.3 F (36.8 C) (Oral)   Resp 20   Ht 5\' 4"  (1.626 m)   Wt 59 kg    SpO2 97%   BMI 22.31 kg/m   Visual Acuity Right Eye Distance:   Left Eye Distance:   Bilateral Distance:    Right Eye Near:   Left Eye Near:    Bilateral Near:     Physical Exam Constitutional:      Appearance: She is not ill-appearing.     Comments: Good hygiene Sitting hunched forward in chair--looks uncomfortable.  Says laying down is most comfortable (although not comfortable) rather than sitting or standing.  HENT:     Head: Atraumatic.     Mouth/Throat:     Mouth: Mucous membranes are moist.  Eyes:     Conjunctiva/sclera:     Right eye: Right conjunctiva is not injected. No exudate.    Left eye: Left conjunctiva is not injected. No exudate.    Comments: Conjugate gaze observed  Cardiovascular:     Rate and Rhythm: Regular rhythm.  Pulmonary:     Effort: Pulmonary effort is normal. No respiratory distress.  Abdominal:     General: There is no distension.  Musculoskeletal:     Cervical back: Neck supple.     Comments: Walked into urgent care independently Pain in midline and left low back, radiates into lateral left buttock  Skin:    General: Skin is warm and dry.     Comments: Pink, no cyanosis  Neurological:     Mental Status: She is alert.     Comments: Face symmetric, speech clear/coherent/logical     UC Treatments / Results  Labs (all labs ordered are listed, but only abnormal results are displayed) Labs Reviewed - No data to display  EKG   Radiology No results found.  Procedures Procedures (including critical care time)  Medications Ordered in UC Medications - No data to display  Initial Impression / Assessment and Plan / UC Course  I have reviewed the triage vital signs and the nursing notes.  Pertinent labs & imaging results that were available during my care of the patient were reviewed by me and considered in my medical decision making (see chart for details).     *** Final Clinical Impressions(s) / UC Diagnoses   Final diagnoses:   Acute left-sided low back pain with left-sided sciatica     Discharge Instructions      Anticipate gradual improvement over the next 2-3 weeks in back pain.  Prescriptions for vicodin (pain medicine,  to use sparingly) and naprosyn (anti inflammatory/pain reliever) were sent to the pharmacy.  Recheck or followup with your primary care provider, a sports medicine provider, or a spine specialist (orthopedics or neurosurgeon) if not improving as anticipated.  Physical therapy, to strengthen core muscles of torso, is helpful to decrease likelihood of future episodes of back pain.   ED Prescriptions     Medication Sig Dispense Auth. Provider   HYDROcodone-acetaminophen (NORCO/VICODIN) 5-325 MG tablet Take 1 tablet by mouth 4 (four) times daily as needed. 20 tablet Isa RankinMurray, Vianca Bracher Wilson, MD   naproxen (NAPROSYN) 500 MG tablet Take 1 tablet (500 mg total) by mouth 2 (two) times daily. 30 tablet Isa RankinMurray, Olander Friedl Wilson, MD      I have reviewed the PDMP during this encounter.

## 2022-06-24 ENCOUNTER — Ambulatory Visit
Admission: EM | Admit: 2022-06-24 | Discharge: 2022-06-24 | Disposition: A | Discharge status: Home / Self Care | Payer: Medicare PPO | Attending: Physician Assistant | Admitting: Physician Assistant

## 2022-06-24 DIAGNOSIS — J321 Chronic frontal sinusitis: Secondary | ICD-10-CM | POA: Diagnosis not present

## 2022-06-24 DIAGNOSIS — J3489 Other specified disorders of nose and nasal sinuses: Secondary | ICD-10-CM | POA: Diagnosis not present

## 2022-06-24 DIAGNOSIS — R0981 Nasal congestion: Secondary | ICD-10-CM

## 2022-06-24 MED ORDER — AZITHROMYCIN 250 MG PO TABS: ORAL_TABLET | ORAL | 0 refills | Status: DC

## 2022-06-24 NOTE — Discharge Instructions (Addendum)
-  Flonase: Over-the-counter medication.  2 sprays each nostril in the morning and over-the-counter allergy medicine (Allegra, Claritin, Zyrtec) at night -Saline nasal rinse or Nettie pot-like product to help flush the sinus passages -Ibuprofen or Tylenol for pain -Push fluids -If not feeling better in 2-3 days, can start the antibiotic. -Azithromycin:Take 2 tablets by mouth the first day followed by one tablet daily for next 4 days.

## 2022-06-24 NOTE — ED Provider Notes (Signed)
[facility]    CSN: [id number] Arrival date & time: 06/24/22  1022      History   Chief Complaint Chief Complaint  Patient presents with   Facial Pain    HPI Rebecca Morris is a 76 y.o. female.   Patient is a 77 year old female who presents with chief complaint of headache, sneezing, cough, and sinus pain that is concerning to her for sinus infection.  Patient reports also having basically no energy.  She denies any fevers or colored mucus production.  States symptoms started about 8 to 9 days ago.  Patient denies any chest pain, shortness of breath or abdominal pain.  Patient has been taking Tylenol at home.    Past Medical History:  Diagnosis Date   Anxiety    Diverticulosis    Hyperlipidemia     There are no problems to display for this patient.   Past Surgical History:  Procedure Laterality Date   ABDOMINAL HYSTERECTOMY     APPENDECTOMY     BREAST CYST ASPIRATION Left 1988   asp cyst   COLONOSCOPY     COLONOSCOPY WITH PROPOFOL N/A 09/14/2017   Procedure: COLONOSCOPY WITH PROPOFOL;  Surgeon: [name], MD;  Location: [facility] ENDOSCOPY;  Service: Gastroenterology;  Laterality: N/A;   COLONOSCOPY WITH PROPOFOL N/A 09/23/2019   Procedure: COLONOSCOPY WITH PROPOFOL;  Surgeon: [name], MD;  Location: [facility] ENDOSCOPY;  Service: Gastroenterology;  Laterality: N/A;   COLONOSCOPY WITH PROPOFOL N/A 04/06/2020   Procedure: COLONOSCOPY WITH PROPOFOL;  Surgeon: [name], MD;  Location: [facility] ENDOSCOPY;  Service: Gastroenterology;  Laterality: N/A;    OB History   No obstetric history on file.      Home Medications    Prior to Admission medications   Medication Sig Start Date End Date Taking? Authorizing Provider  azithromycin (ZITHROMAX Z-PAK) 250 MG tablet Take as directed on package 06/24/22  Yes Candis Schatz, PA-C  atorvastatin (LIPITOR) 20 MG tablet Take 20 mg by mouth daily at 6 PM.  06/23/17   [provider]  dicyclomine (BENTYL) 10  MG capsule TAKE 1 CAPSULE (10 MG TOTAL) BY MOUTH 4 (FOUR) TIMES DAILY - BEFORE MEALS AND AT BEDTIME. 01/10/22   [name], MD  estradiol (ESTRACE) 1 MG tablet Take 1 mg by mouth daily. 07/20/17   [provider]  FLUoxetine (PROZAC) 20 MG tablet Take 20 mg by mouth daily. 06/09/17   [provider]  HYDROcodone-acetaminophen (NORCO/VICODIN) 5-325 MG tablet Take 1 tablet by mouth 4 (four) times daily as needed. 04/26/22   Isa Rankin, MD  Multiple Vitamin (MULTIVITAMIN) tablet Take 1 tablet by mouth daily.    [provider]  naproxen (NAPROSYN) 500 MG tablet Take 1 tablet (500 mg total) by mouth 2 (two) times daily. 04/26/22   Isa Rankin, MD  cetirizine (ZYRTEC) 10 MG tablet Take 10 mg by mouth daily.   03/05/20  [provider]    Family History Family History  Problem Relation Age of Onset   Breast cancer Mother 32   Hypertension Mother    Lymphoma Father     Social History Social History   Tobacco Use   Smoking status: Never   Smokeless tobacco: Never  Vaping Use   Vaping Use: Never used  Substance Use Topics   Alcohol use: No   Drug use: No     Allergies   Cipro [ciprofloxacin-ciproflox hcl er] and Macrodantin [nitrofurantoin macrocrystal]   Review of Systems Review of Systems  as noted above in HPI.  Other systems reviewed and found to be negative   Physical Exam Triage Vital Signs ED Triage Vitals [06/24/22 1046]  Enc Vitals Group     BP 128/71     Pulse Rate 96     Resp 18     Temp 97.7 F (36.5 C)     Temp Source Oral     SpO2 95 %     Weight      Height      Head Circumference      Peak Flow      Pain Score 0     Pain Loc      Pain Edu?      Excl. in GC?    No data found.  Updated Vital Signs BP 128/71 (BP Location: Left Arm)   Pulse 96   Temp 97.7 F (36.5 C) (Oral)   Resp 18   SpO2 95%   Visual Acuity Right Eye Distance:   Left Eye Distance:   Bilateral Distance:    Right Eye Near:    Left Eye Near:    Bilateral Near:     Physical Exam Constitutional:      Appearance: Normal appearance.  HENT:     Right Ear: A middle ear effusion is present.     Left Ear: Tympanic membrane normal.  No middle ear effusion.     Nose: Congestion present.     Right Sinus: Frontal sinus tenderness present. No maxillary sinus tenderness.     Left Sinus: Frontal sinus tenderness present. No maxillary sinus tenderness.  Cardiovascular:     Rate and Rhythm: Normal rate and regular rhythm.  Pulmonary:     Effort: Pulmonary effort is normal. No respiratory distress.     Breath sounds: Normal breath sounds. No wheezing.  Musculoskeletal:     Cervical back: Normal range of motion.  Lymphadenopathy:     Cervical: No cervical adenopathy.  Neurological:     General: No focal deficit present.     Mental Status: She is alert and oriented to person, place, and time.      UC Treatments / Results  Labs (all labs ordered are listed, but only abnormal results are displayed) Labs Reviewed - No data to display  EKG   Radiology No results found.  Procedures Procedures (including critical care time)  Medications Ordered in UC Medications - No data to display  Initial Impression / Assessment and Plan / UC Course  I have reviewed the triage vital signs and the nursing notes.  Pertinent labs & imaging results that were available during my care of the patient were reviewed by me and considered in my medical decision making (see chart for details).    Exam as above.  Patient with nasal congestion with frequent sniffling.  Maxillary sinus tenderness.  Likely combination of seasonal allergies with possible sinus infection brewing.  Given instructions for Flonase in the morning and allergy medication at night and sinus rinses.  Given her history, if no improvement next couple days to allow her to start a dose of azithromycin Final Clinical Impressions(s) / UC Diagnoses   Final diagnoses:   Nasal congestion  Sinus pain  Frontal sinusitis, unspecified chronicity     Discharge Instructions      -Flonase: Over-the-counter medication.  2 sprays each nostril in the morning and over-the-counter allergy medicine (Allegra, Claritin, Zyrtec) at night -Saline nasal rinse or Nettie pot-like product to help flush the sinus passages -Ibuprofen or  Tylenol for pain -Push fluids -If not feeling better in 2-3 days, can start the antibiotic. -Azithromycin:Take 2 tablets by mouth the first day followed by one tablet daily for next 4 days.       ED Prescriptions     Medication Sig Dispense Auth. Provider   azithromycin (ZITHROMAX Z-PAK) 250 MG tablet Take as directed on package 6 tablet Candis Schatz, PA-C      PDMP not reviewed this encounter.   Candis Schatz, PA-C 06/24/22 1106

## 2022-06-24 NOTE — ED Triage Notes (Signed)
Pt present nasal congestion with headache. Symptom started 14 days, pt states having head pressure with fatigue.

## 2022-07-21 ENCOUNTER — Other Ambulatory Visit: Payer: Self-pay | Admitting: Gastroenterology

## 2022-07-21 DIAGNOSIS — R1032 Left lower quadrant pain: Secondary | ICD-10-CM

## 2022-07-28 ENCOUNTER — Other Ambulatory Visit: Payer: Self-pay | Admitting: Certified Nurse Midwife

## 2022-07-28 DIAGNOSIS — Z1231 Encounter for screening mammogram for malignant neoplasm of breast: Secondary | ICD-10-CM

## 2022-08-08 ENCOUNTER — Ambulatory Visit
Admission: EM | Admit: 2022-08-08 | Discharge: 2022-08-08 | Disposition: A | Discharge status: Home / Self Care | Payer: Medicare PPO | Attending: Internal Medicine | Admitting: Internal Medicine

## 2022-08-08 DIAGNOSIS — R519 Headache, unspecified: Secondary | ICD-10-CM

## 2022-08-08 DIAGNOSIS — J302 Other seasonal allergic rhinitis: Secondary | ICD-10-CM

## 2022-08-08 MED ORDER — FLUTICASONE PROPIONATE 50 MCG/ACT NA SUSP: 2.0000 | Freq: Every day | NASAL | 0 refills | Status: AC

## 2022-08-08 MED ORDER — METHYLPREDNISOLONE 4 MG PO TBPK: ORAL_TABLET | ORAL | 0 refills | Status: DC

## 2022-08-08 NOTE — ED Provider Notes (Signed)
[facility]    CSN: [id number] Arrival date & time: 08/08/22  1039      History   Chief Complaint No chief complaint on file.   HPI Rebecca Morris is a 76 y.o. female who presents with hx of nose congestion and rhinitis x 3 weeks. Has pressure on her forehead. Has never had sinus surgeries. Has been taking Zyrtec, and Afrin nose spray qd.  Denies fever, chills or sweats. No cough. Nose mucous is clear.  Gets this every year and is diagnosed with sinusitis. She has never seen ENT.  Has done sinus netie pot rinses in the past without helping her much . Has not tried it this time.    Past Medical History:  Diagnosis Date   Anxiety    Diverticulosis    Hyperlipidemia     There are no problems to display for this patient.   Past Surgical History:  Procedure Laterality Date   ABDOMINAL HYSTERECTOMY     APPENDECTOMY     BREAST CYST ASPIRATION Left 1988   asp cyst   COLONOSCOPY     COLONOSCOPY WITH PROPOFOL N/A 09/14/2017   Procedure: COLONOSCOPY WITH PROPOFOL;  Surgeon: [name], MD;  Location: [facility] ENDOSCOPY;  Service: Gastroenterology;  Laterality: N/A;   COLONOSCOPY WITH PROPOFOL N/A 09/23/2019   Procedure: COLONOSCOPY WITH PROPOFOL;  Surgeon: [name], MD;  Location: [facility] ENDOSCOPY;  Service: Gastroenterology;  Laterality: N/A;   COLONOSCOPY WITH PROPOFOL N/A 04/06/2020   Procedure: COLONOSCOPY WITH PROPOFOL;  Surgeon: [name], MD;  Location: [facility] ENDOSCOPY;  Service: Gastroenterology;  Laterality: N/A;    OB History   No obstetric history on file.      Home Medications    Prior to Admission medications   Medication Sig Start Date End Date Taking? Authorizing Provider  atorvastatin (LIPITOR) 20 MG tablet Take 20 mg by mouth daily at 6 PM.  06/23/17  Yes [provider]  dicyclomine (BENTYL) 10 MG capsule TAKE 1 CAPSULE (10 MG TOTAL) BY MOUTH 4 TIMES A DAY BEFORE MEALS AND AT BEDTIME 07/21/22  Yes [name], MD  estradiol (ESTRACE) 1  MG tablet Take 1 mg by mouth daily. 07/20/17  Yes [provider]  FLUoxetine (PROZAC) 20 MG tablet Take 20 mg by mouth daily. 06/09/17  Yes [provider]  fluticasone (FLONASE) 50 MCG/ACT nasal spray Place 2 sprays into both nostrils daily. 08/08/22  Yes Rodriguez-Southworth, Nettie Elm, PA-C  methylPREDNISolone (MEDROL DOSEPAK) 4 MG TBPK tablet Take as directed 08/08/22  Yes Rodriguez-Southworth, Nettie Elm, PA-C  Multiple Vitamin (MULTIVITAMIN) tablet Take 1 tablet by mouth daily.   Yes [provider]  cetirizine (ZYRTEC) 10 MG tablet Take 10 mg by mouth daily.   03/05/20  [provider]    Family History Family History  Problem Relation Age of Onset   Breast cancer Mother 13   Hypertension Mother    Lymphoma Father     Social History Social History   Tobacco Use   Smoking status: Never   Smokeless tobacco: Never  Vaping Use   Vaping Use: Never used  Substance Use Topics   Alcohol use: No   Drug use: No     Allergies   Cipro [ciprofloxacin-ciproflox hcl er] and Macrodantin [nitrofurantoin macrocrystal]   Review of Systems Review of Systems  Constitutional:  Positive for chills. Negative for appetite change and fever.  HENT:  Positive for congestion, rhinorrhea, sinus pressure and sinus pain. Negative for ear discharge, ear pain and sore throat.  Respiratory:  Negative for cough.   Musculoskeletal:  Negative for myalgias.  Skin:  Negative for rash.  Neurological:  Positive for headaches.     Physical Exam Triage Vital Signs ED Triage Vitals  Enc Vitals Group     BP 08/08/22 1119 (!) 139/100     Pulse Rate 08/08/22 1119 77     Resp 08/08/22 1119 18     Temp 08/08/22 1119 97.9 F (36.6 C)     Temp Source 08/08/22 1119 Oral     SpO2 08/08/22 1119 100 %     Weight 08/08/22 1117 147 lb (66.7 kg)     Height 08/08/22 1117 5\' 4"  (1.626 m)     Head Circumference --      Peak Flow --      Pain Score 08/08/22 1116 2     Pain Loc --       Pain Edu? --      Excl. in GC? --    No data found.  Updated Vital Signs BP (!) 139/100 (BP Location: Left Arm)   Pulse 77   Temp 97.9 F (36.6 C) (Oral)   Resp 18   Ht 5\' 4"  (1.626 m)   Wt 147 lb (66.7 kg)   SpO2 100%   BMI 25.23 kg/m   Visual Acuity Right Eye Distance:   Left Eye Distance:   Bilateral Distance:    Right Eye Near:   Left Eye Near:    Bilateral Near:     Physical Exam Alert pt NAD who seems nasally congested EYES- non icterus, mild watering, no purulent drainage NOSE- moderate mucosa congestion which is pale pink with clear mucous. Has sinus tenderness on both ethmoids and maxillary sinuses. No tenderness on frontal sinuses. Her frontal and maxillary sinuses transillumination test is normal.  TM- both gray and little dull, canals are normal PHARYNX- clear, clear drainage noted.  NECK- supple with no nodes LUNGS- clear HEART - RRR with no murmurs SKIN- non jaundiced, no rashes.    UC Treatments / Results  Labs (all labs ordered are listed, but only abnormal results are displayed) Labs Reviewed - No data to display  EKG   Radiology No results found.  Procedures Procedures (including critical care time)  Medications Ordered in UC Medications - No data to display  Initial Impression / Assessment and Plan / UC Course  I have reviewed the triage vital signs and the nursing notes.  Allergic rhinitis Sinus headache  I advised her to d/c Afrin since she can have rebound symptoms using this all the time. I placed her on Floase and Medrol as noted. Needs to FU with ENT Final Clinical Impressions(s) / UC Diagnoses   Final diagnoses:  Sinus headache  Seasonal allergic rhinitis, unspecified trigger     Discharge Instructions      Your exam does not show you have a bacterial sinus infection, so you dont need antibiotics You just have inflammation of your sinuses.       ED Prescriptions     Medication Sig Dispense Auth. Provider    fluticasone (FLONASE) 50 MCG/ACT nasal spray Place 2 sprays into both nostrils daily. 18 g Rodriguez-Southworth, Uzziah Rigg, PA-C   methylPREDNISolone (MEDROL DOSEPAK) 4 MG TBPK tablet Take as directed 21 tablet Rodriguez-Southworth, 10/08/22, PA-C      PDMP not reviewed this encounter.   , PA-C 08/08/22 1213

## 2022-08-08 NOTE — ED Triage Notes (Signed)
Pt c/o possible sinus infection. Pt has had headache, fatigue, sneezing x3weeks

## 2022-08-08 NOTE — Discharge Instructions (Addendum)
Your exam does not show you have a bacterial sinus infection, so you dont need antibiotics You just have inflammation of your sinuses.

## 2022-09-20 ENCOUNTER — Ambulatory Visit: Payer: Medicare PPO

## 2022-10-25 ENCOUNTER — Ambulatory Visit: Payer: Medicare PPO

## 2022-11-17 ENCOUNTER — Other Ambulatory Visit: Payer: Self-pay | Admitting: Family Medicine

## 2022-11-17 ENCOUNTER — Ambulatory Visit
Admission: RE | Admit: 2022-11-17 | Discharge: 2022-11-17 | Disposition: A | Discharge status: Home / Self Care | Payer: Medicare PPO | Attending: Family Medicine | Admitting: Family Medicine

## 2022-11-17 ENCOUNTER — Ambulatory Visit
Admission: RE | Admit: 2022-11-17 | Discharge: 2022-11-17 | Disposition: A | Discharge status: Home / Self Care | Payer: Medicare PPO | Source: Ambulatory Visit | Attending: Family Medicine | Admitting: Family Medicine

## 2022-11-17 DIAGNOSIS — M5441 Lumbago with sciatica, right side: Secondary | ICD-10-CM | POA: Insufficient documentation

## 2022-12-19 ENCOUNTER — Ambulatory Visit
Admission: RE | Admit: 2022-12-19 | Discharge: 2022-12-19 | Disposition: A | Discharge status: Home / Self Care | Payer: Medicare PPO | Source: Ambulatory Visit | Attending: Certified Nurse Midwife | Admitting: Certified Nurse Midwife

## 2022-12-19 DIAGNOSIS — Z1231 Encounter for screening mammogram for malignant neoplasm of breast: Secondary | ICD-10-CM | POA: Insufficient documentation

## 2023-01-16 ENCOUNTER — Other Ambulatory Visit: Payer: Self-pay | Admitting: Gastroenterology

## 2023-01-16 DIAGNOSIS — R1032 Left lower quadrant pain: Secondary | ICD-10-CM

## 2023-03-14 ENCOUNTER — Ambulatory Visit
Admission: EM | Admit: 2023-03-14 | Discharge: 2023-03-14 | Disposition: A | Discharge status: Home / Self Care | Payer: Medicare PPO

## 2023-03-14 DIAGNOSIS — L249 Irritant contact dermatitis, unspecified cause: Secondary | ICD-10-CM | POA: Diagnosis not present

## 2023-03-14 MED ORDER — METHYLPREDNISOLONE 4 MG PO TBPK: ORAL_TABLET | ORAL | 0 refills | Status: DC

## 2023-03-14 NOTE — ED Provider Notes (Signed)
[facility]    CSN: [id number] Arrival date & time: 03/14/23  5621      History   Chief Complaint No chief complaint on file.   HPI Rebecca Morris is a 77 y.o. female who presents with itchy rash on her L forearm and L upper breast x 3 days. Has been working in the yard and wore gloves and did have long sleeve. But she also pet her neighbors cat. She has sensitive skin and gets contact dermatitis about twice a year.     Past Medical History:  Diagnosis Date   Anxiety    Diverticulosis    Hyperlipidemia     There are no problems to display for this patient.   Past Surgical History:  Procedure Laterality Date   ABDOMINAL HYSTERECTOMY     APPENDECTOMY     BREAST CYST ASPIRATION Left 1988   asp cyst   COLONOSCOPY     COLONOSCOPY WITH PROPOFOL N/A 09/14/2017   Procedure: COLONOSCOPY WITH PROPOFOL;  Surgeon: [name], MD;  Location: [facility] ENDOSCOPY;  Service: Gastroenterology;  Laterality: N/A;   COLONOSCOPY WITH PROPOFOL N/A 09/23/2019   Procedure: COLONOSCOPY WITH PROPOFOL;  Surgeon: [name], MD;  Location: [facility] ENDOSCOPY;  Service: Gastroenterology;  Laterality: N/A;   COLONOSCOPY WITH PROPOFOL N/A 04/06/2020   Procedure: COLONOSCOPY WITH PROPOFOL;  Surgeon: [name], MD;  Location: [facility] ENDOSCOPY;  Service: Gastroenterology;  Laterality: N/A;    OB History   No obstetric history on file.      Home Medications    Prior to Admission medications   Medication Sig Start Date End Date Taking? Authorizing Provider  atorvastatin (LIPITOR) 20 MG tablet Take 20 mg by mouth daily at 6 PM.  06/23/17   [provider]  baclofen (LIORESAL) 10 MG tablet Take 10 mg by mouth 3 (three) times daily as needed.    [provider]  dicyclomine (BENTYL) 10 MG capsule TAKE 1 CAPSULE (10 MG TOTAL) BY MOUTH 4 TIMES A DAY BEFORE MEALS AND AT BEDTIME 07/21/22   [name], MD  estradiol (ESTRACE) 1 MG tablet Take 1 mg by mouth daily. 07/20/17   [provider]  FLUoxetine (PROZAC) 20 MG tablet Take 20 mg by mouth daily. 06/09/17   [provider]  fluticasone (FLONASE) 50 MCG/ACT nasal spray Place 2 sprays into both nostrils daily. 08/08/22   Rodriguez-Southworth, Nettie Elm, PA-C  methylPREDNISolone (MEDROL DOSEPAK) 4 MG TBPK tablet Take as directed 03/14/23   Rodriguez-Southworth, Nettie Elm, PA-C  Multiple Vitamin (MULTIVITAMIN) tablet Take 1 tablet by mouth daily.    [provider]  cetirizine (ZYRTEC) 10 MG tablet Take 10 mg by mouth daily.   03/05/20  [provider]    Family History Family History  Problem Relation Age of Onset   Breast cancer Mother 28   Hypertension Mother    Lymphoma Father     Social History Social History   Tobacco Use   Smoking status: Never   Smokeless tobacco: Never  Vaping Use   Vaping Use: Never used  Substance Use Topics   Alcohol use: No   Drug use: No     Allergies   Cipro [ciprofloxacin-ciproflox hcl er] and Macrodantin [nitrofurantoin macrocrystal]   Review of Systems Review of Systems  Skin:  Positive for rash. Negative for wound.     Physical Exam Triage Vital Signs ED Triage Vitals  Enc Vitals Group     BP 03/14/23 1037 134/77     Pulse Rate  03/14/23 1037 75     Resp 03/14/23 1037 18     Temp 03/14/23 1037 (!) 97.5 F (36.4 C)     Temp Source 03/14/23 1037 Oral     SpO2 03/14/23 1037 93 %     Weight --      Height --      Head Circumference --      Peak Flow --      Pain Score 03/14/23 1040 3     Pain Loc --      Pain Edu? --      Excl. in GC? --    No data found.  Updated Vital Signs BP 134/77 (BP Location: Right Arm)   Pulse 75   Temp (!) 97.5 F (36.4 C) (Oral)   Resp 18   SpO2 93%   Visual Acuity Right Eye Distance:   Left Eye Distance:   Bilateral Distance:    Right Eye Near:   Left Eye Near:    Bilateral Near:     Physical Exam Vitals and nursing note reviewed.  Constitutional:      General: She is not in acute  distress.    Appearance: She is not toxic-appearing.  Eyes:     General: No scleral icterus.    Conjunctiva/sclera: Conjunctivae normal.  Pulmonary:     Effort: Pulmonary effort is normal.  Musculoskeletal:        General: Normal range of motion.     Cervical back: Neck supple.  Skin:    Findings: Rash present.     Comments: Mild erythematous raised maculopapular rash from mid volar wrist to antecubital area, and more linear on L upper breast. Not oozing or tender  Neurological:     Mental Status: She is alert.  Psychiatric:        Mood and Affect: Mood normal.        Behavior: Behavior normal.        Thought Content: Thought content normal.        Judgment: Judgment normal.      UC Treatments / Results  Labs (all labs ordered are listed, but only abnormal results are displayed) Labs Reviewed - No data to display  EKG   Radiology No results found.  Procedures Procedures (including critical care time)  Medications Ordered in UC Medications - No data to display  Initial Impression / Assessment and Plan / UC Course  I have reviewed the triage vital signs and the nursing notes.  Contact dermatitis  Placed on Medrol dose  pack as noted.  Final Clinical Impressions(s) / UC Diagnoses   Final diagnoses:  Irritant contact dermatitis, unspecified trigger   Discharge Instructions   None    ED Prescriptions     Medication Sig Dispense Auth. Provider   methylPREDNISolone (MEDROL DOSEPAK) 4 MG TBPK tablet Take as directed 21 tablet Rodriguez-Southworth, Nettie Elm, PA-C      PDMP not reviewed this encounter.   Garey Ham, PA-C 03/14/23 1113

## 2023-03-14 NOTE — ED Triage Notes (Addendum)
Pt c/o rash on left arm x 3 days, started Saturday on wrist and moved up  the arm, pt has used cortisone creams , that have helped a little with the itching, but has not gotten rid of the rash pt is unaware of where the rash could have come from.

## 2023-03-20 ENCOUNTER — Other Ambulatory Visit: Payer: Self-pay | Admitting: Gastroenterology

## 2023-03-20 DIAGNOSIS — R1032 Left lower quadrant pain: Secondary | ICD-10-CM

## 2023-06-16 ENCOUNTER — Emergency Department
Admission: EM | Admit: 2023-06-16 | Discharge: 2023-06-16 | Disposition: A | Discharge status: Home / Self Care | Payer: Medicare PPO | Attending: Emergency Medicine | Admitting: Emergency Medicine

## 2023-06-16 ENCOUNTER — Emergency Department: Payer: Medicare PPO

## 2023-06-16 ENCOUNTER — Other Ambulatory Visit: Payer: Self-pay

## 2023-06-16 DIAGNOSIS — R1030 Lower abdominal pain, unspecified: Secondary | ICD-10-CM

## 2023-06-16 DIAGNOSIS — D72829 Elevated white blood cell count, unspecified: Secondary | ICD-10-CM | POA: Insufficient documentation

## 2023-06-16 DIAGNOSIS — R197 Diarrhea, unspecified: Secondary | ICD-10-CM | POA: Diagnosis not present

## 2023-06-16 DIAGNOSIS — R1010 Upper abdominal pain, unspecified: Secondary | ICD-10-CM | POA: Insufficient documentation

## 2023-06-16 DIAGNOSIS — R1032 Left lower quadrant pain: Secondary | ICD-10-CM | POA: Diagnosis not present

## 2023-06-16 LAB — COMPREHENSIVE METABOLIC PANEL
ALT: 26 U/L (ref 0–44)
AST: 32 U/L (ref 15–41)
Albumin: 4 g/dL (ref 3.5–5.0)
Alkaline Phosphatase: 57 U/L (ref 38–126)
Anion gap: 11 (ref 5–15)
BUN: 31 mg/dL — ABNORMAL HIGH (ref 8–23)
CO2: 22 mmol/L (ref 22–32)
Calcium: 9.4 mg/dL (ref 8.9–10.3)
Chloride: 102 mmol/L (ref 98–111)
Creatinine, Ser: 0.84 mg/dL (ref 0.44–1.00)
GFR, Estimated: >60 mL/min (ref >60)
Glucose, Bld: 142 mg/dL — ABNORMAL HIGH (ref 70–99)
Potassium: 4.9 mmol/L (ref 3.5–5.1)
Sodium: 135 mmol/L (ref 135–145)
Total Bilirubin: 0.4 mg/dL (ref 0.3–1.2)
Total Protein: 8 g/dL (ref 6.5–8.1)

## 2023-06-16 LAB — CBC
HCT: 40 % (ref 36.0–46.0)
Hemoglobin: 12.7 g/dL (ref 12.0–15.0)
MCH: 30.4 pg (ref 26.0–34.0)
MCHC: 31.8 g/dL (ref 30.0–36.0)
MCV: 95.7 fL (ref 80.0–100.0)
Platelets: 367 10*3/uL (ref 150–400)
RBC: 4.18 MIL/uL (ref 3.87–5.11)
RDW: 15.7 % — ABNORMAL HIGH (ref 11.5–15.5)
WBC: 12.7 10*3/uL — ABNORMAL HIGH (ref 4.0–10.5)
nRBC: 0 % (ref 0.0–0.2)

## 2023-06-16 LAB — URINALYSIS, ROUTINE W REFLEX MICROSCOPIC
Bilirubin Urine: NEGATIVE
Glucose, UA: NEGATIVE mg/dL
Hgb urine dipstick: NEGATIVE
Ketones, ur: NEGATIVE mg/dL
Leukocytes,Ua: NEGATIVE
Nitrite: NEGATIVE
Protein, ur: NEGATIVE mg/dL
Specific Gravity, Urine: 1.012 (ref 1.005–1.030)
pH: 5 (ref 5.0–8.0)

## 2023-06-16 LAB — LIPASE, BLOOD: Lipase: 42 U/L (ref 11–51)

## 2023-06-16 MED ORDER — ONDANSETRON HCL 4 MG/2ML IJ SOLN
INTRAMUSCULAR | Status: AC
  Filled 2023-06-16: qty 2

## 2023-06-16 MED ORDER — ONDANSETRON HCL 4 MG/2ML IJ SOLN
4.0000 mg | Freq: Once | INTRAMUSCULAR | Status: AC
  Administered 2023-06-16: 4 mg via INTRAVENOUS

## 2023-06-16 MED ORDER — AMOXICILLIN-POT CLAVULANATE 875-125 MG PO TABS
ORAL_TABLET | ORAL | Status: AC
  Filled 2023-06-16: qty 1

## 2023-06-16 MED ORDER — AMOXICILLIN-POT CLAVULANATE 875-125 MG PO TABS: 1.0000 | ORAL_TABLET | Freq: Two times a day (BID) | ORAL | 0 refills | Status: AC

## 2023-06-16 MED ORDER — IOHEXOL 300 MG/ML  SOLN
100.0000 mL | Freq: Once | INTRAMUSCULAR | Status: AC | PRN
  Administered 2023-06-16: 100 mL via INTRAVENOUS

## 2023-06-16 MED ORDER — LACTATED RINGERS IV BOLUS
1000.0000 mL | Freq: Once | INTRAVENOUS | Status: AC
  Administered 2023-06-16: 1000 mL via INTRAVENOUS

## 2023-06-16 MED ORDER — AMOXICILLIN-POT CLAVULANATE 875-125 MG PO TABS
1.0000 | ORAL_TABLET | Freq: Once | ORAL | Status: AC
  Administered 2023-06-16: 1 via ORAL

## 2023-06-16 NOTE — ED Triage Notes (Signed)
Pt to ED via POV c/o diarrhea and lower abd cramping that started Monday. Pt has had multiple episodes of diarrhea, no nausea or vomiting. Pt has taken imodium and has provided relief until tonight. Pt has hx of diverticulitis.

## 2023-06-16 NOTE — ED Provider Notes (Signed)
[facility] Provider Note    Event Date/Time   First MD Initiated Contact with Patient 06/16/23 [phone]     (approximate)   History   Diarrhea   HPI  Rebecca J Moger is a 77 y.o. female who presents to the ED for evaluation of Diarrhea   With a history of diverticulosis presents to the ED with lower abdominal cramping and diarrhea.  She reports diarrhea for the past 3 to 4 days, watery in nature, 5-10 episodes per day.  No emesis or upper abdominal discomfort or urinary changes.  Denies any hematochezia or melena.  Cramping has been worsening over the past 1-2 days.  No fevers.  No sick contacts.   Physical Exam   Triage Vital Signs: ED Triage Vitals  Encounter Vitals Group     BP 06/16/23 0045 (!) 154/76     Systolic BP Percentile --      Diastolic BP Percentile --      Pulse Rate 06/16/23 0045 93     Resp 06/16/23 0045 20     Temp 06/16/23 0045 98.2 F (36.8 C)     Temp Source 06/16/23 0045 Oral     SpO2 06/16/23 0045 99 %     Weight 06/16/23 0047 103 lb (46.7 kg)     Height 06/16/23 0047 5\' 4"  (1.626 m)     Head Circumference --      Peak Flow --      Pain Score 06/16/23 0046 4     Pain Loc --      Pain Education --      Exclude from Growth Chart --     Most recent vital signs: Vitals:   06/16/23 0500 06/16/23 0614  BP: (!) 165/61 (!) 164/65  Pulse: 72 73  Resp: 16 16  Temp:    SpO2: 97% 96%    General: Awake, no distress.  Well-appearing, sitting upright on the edge of the bed CV:  Good peripheral perfusion.  Resp:  Normal effort.  Abd:  No distention.  Diffuse lower abdominal tenderness, most pronounced to the LLQ. Upper abdomen. MSK:  No deformity noted.  Neuro:  No focal deficits appreciated. Other:     ED Results / Procedures / Treatments   Labs (all labs ordered are listed, but only abnormal results are displayed) Labs Reviewed  COMPREHENSIVE METABOLIC PANEL - Abnormal; Notable for the following components:       Result Value   Glucose, Bld 142 (*)    BUN 31 (*)    All other components within normal limits  CBC - Abnormal; Notable for the following components:   WBC 12.7 (*)    RDW 15.7 (*)    All other components within normal limits  URINALYSIS, ROUTINE W REFLEX MICROSCOPIC - Abnormal; Notable for the following components:   Color, Urine YELLOW (*)    APPearance CLEAR (*)    All other components within normal limits  LIPASE, BLOOD    EKG   RADIOLOGY   Official radiology report(s): No results found.  PROCEDURES and INTERVENTIONS:  Procedures  Medications  ondansetron (ZOFRAN) injection 4 mg (4 mg Intravenous Given 06/16/23 0507)  lactated ringers bolus 1,000 mL (1,000 mLs Intravenous New Bag/Given 06/16/23 0508)  amoxicillin-clavulanate (AUGMENTIN) 875-125 MG per tablet 1 tablet (1 tablet Oral Given 06/16/23 0508)  iohexol (OMNIPAQUE) 300 MG/ML solution 100 mL (100 mLs Intravenous Contrast Given 06/16/23 0601)     IMPRESSION / MDM / ASSESSMENT AND PLAN / ED  COURSE  I reviewed the triage vital signs and the nursing notes.  Differential diagnosis includes, but is not limited to, diverticulitis, appendicitis, SBO, abscess  {Patient presents with symptoms of an acute illness or injury that is potentially life-threatening.  Pt presents with lower abd pain and diarrhea concerning for diverticulitis. Blood work with leukocytosis. Normal metabolic panel, lipase and urine. Starting empiric ABX for divertic and pending CT at the time of signout to assess for complications  Clinical Course as of 06/16/23 0647  [facility] Jun 16, 2023  0521 Recess.  Fluids ongoing.  She reports continued significant cramping to her LLQ.  We discussed CT imaging, while we agreed not to perform imaging before we agreed to go ahead and get CT even though we do not know when we will be able to get her reviewed, to assess for complications of possible diverticulitis. [DS]    Clinical Course User Index [DS] Delton Prairie, MD     FINAL CLINICAL IMPRESSION(S) / ED DIAGNOSES   Final diagnoses:  Lower abdominal pain  Diarrhea, unspecified type     Rx / DC Orders   ED Discharge Orders     None        Note:  This document was prepared using Dragon voice recognition software and may include unintentional dictation errors.   Delton Prairie, MD 06/16/23 [phone]

## 2023-06-22 ENCOUNTER — Telehealth: Payer: Self-pay | Admitting: Gastroenterology

## 2023-06-22 NOTE — Telephone Encounter (Signed)
Patient called in to schedule an appointment with doctor for she is concern about her CT. She wanted a sooner appointment when didn't have any we still schedule her appointment and I add her to the waiting list.

## 2023-08-15 ENCOUNTER — Ambulatory Visit: Payer: Medicare PPO | Admitting: Gastroenterology

## 2023-09-10 ENCOUNTER — Ambulatory Visit
Admission: EM | Admit: 2023-09-10 | Discharge: 2023-09-10 | Disposition: A | Discharge status: Home / Self Care | Payer: Medicare PPO | Source: Home / Self Care

## 2023-09-10 ENCOUNTER — Other Ambulatory Visit: Payer: Self-pay

## 2023-09-10 ENCOUNTER — Encounter: Payer: Self-pay | Admitting: Emergency Medicine

## 2023-09-10 ENCOUNTER — Emergency Department
Admission: EM | Admit: 2023-09-10 | Discharge: 2023-09-10 | Disposition: A | Discharge status: Home / Self Care | Payer: Medicare PPO | Attending: Emergency Medicine | Admitting: Emergency Medicine

## 2023-09-10 DIAGNOSIS — N3000 Acute cystitis without hematuria: Secondary | ICD-10-CM | POA: Insufficient documentation

## 2023-09-10 DIAGNOSIS — R531 Weakness: Secondary | ICD-10-CM | POA: Insufficient documentation

## 2023-09-10 DIAGNOSIS — J9601 Acute respiratory failure with hypoxia: Secondary | ICD-10-CM

## 2023-09-10 DIAGNOSIS — N390 Urinary tract infection, site not specified: Secondary | ICD-10-CM | POA: Diagnosis not present

## 2023-09-10 DIAGNOSIS — R051 Acute cough: Secondary | ICD-10-CM

## 2023-09-10 DIAGNOSIS — Z90710 Acquired absence of both cervix and uterus: Secondary | ICD-10-CM | POA: Diagnosis not present

## 2023-09-10 DIAGNOSIS — A419 Sepsis, unspecified organism: Secondary | ICD-10-CM | POA: Diagnosis not present

## 2023-09-10 DIAGNOSIS — R300 Dysuria: Secondary | ICD-10-CM | POA: Diagnosis present

## 2023-09-10 LAB — URINALYSIS, W/ REFLEX TO CULTURE (INFECTION SUSPECTED): WBC, UA: >50 WBC/hpf (ref 0–5)

## 2023-09-10 LAB — URINALYSIS, ROUTINE W REFLEX MICROSCOPIC
Bilirubin Urine: NEGATIVE
Glucose, UA: NEGATIVE mg/dL
Hgb urine dipstick: NEGATIVE
Ketones, ur: NEGATIVE mg/dL
Nitrite: POSITIVE — AB
Protein, ur: NEGATIVE mg/dL
Specific Gravity, Urine: 1.014 (ref 1.005–1.030)
WBC, UA: >50 WBC/hpf (ref 0–5)
pH: 5 (ref 5.0–8.0)

## 2023-09-10 MED ORDER — SODIUM CHLORIDE 0.9 % IV SOLN
1.0000 g | INTRAVENOUS | Status: AC
  Administered 2023-09-10: 1 g via INTRAVENOUS
  Filled 2023-09-10: qty 10

## 2023-09-10 MED ORDER — CEFADROXIL 500 MG PO CAPS: 1000.0000 mg | ORAL_CAPSULE | Freq: Two times a day (BID) | ORAL | 0 refills | Status: AC

## 2023-09-10 MED ORDER — SODIUM CHLORIDE 0.9 % IV BOLUS
1000.0000 mL | Freq: Once | INTRAVENOUS | Status: AC
  Administered 2023-09-10: 1000 mL via INTRAVENOUS

## 2023-09-10 NOTE — Discharge Instructions (Signed)
We gave you a first dose of IV antibiotics in the emergency department.  Please begin your outpatient prescription for oral antibiotics starting tomorrow (Monday) and take the full course of treatment as prescribed.  Follow-up with your regular doctor as needed.  Return to the emergency department if you develop new or worsening symptoms that concern you.

## 2023-09-10 NOTE — ED Triage Notes (Signed)
Presents via EMS from [facility]  States she went because of urinary freq for the past 10 days  No fever  Has tired OTC meds with min relief

## 2023-09-10 NOTE — ED Provider Notes (Signed)
[facility]    CSN: [id number] Arrival date & time: 09/10/23  1055      History   Chief Complaint Chief Complaint  Patient presents with   Urinary Tract Infection    HPI Rebecca Morris is a 77 y.o. female with no history of cardiopulmonary disease.  She presents today for feeling fatigued and weak for the past 10 days.  Reports urinary frequency, dysuria, cough and congestion for the past 10 days or so.  She says she has been feeling progressively worse.  She reports she thinks she just has a UTI and can no longer fight it.  Has tried Pyridium over-the-counter.  Currently taking this.  She denies any associated fevers, chest pain, shortness of breath, palpitations, abdominal pain, flank pain, hematuria.  No vomiting, diarrhea or changes in bowel habits.  HPI  Past Medical History:  Diagnosis Date   Anxiety    Diverticulosis    Hyperlipidemia     There are no problems to display for this patient.   Past Surgical History:  Procedure Laterality Date   ABDOMINAL HYSTERECTOMY     APPENDECTOMY     BREAST CYST ASPIRATION Left 1988   asp cyst   COLONOSCOPY     COLONOSCOPY WITH PROPOFOL N/A 09/14/2017   Procedure: COLONOSCOPY WITH PROPOFOL;  Surgeon: [name], MD;  Location: [facility] ENDOSCOPY;  Service: Gastroenterology;  Laterality: N/A;   COLONOSCOPY WITH PROPOFOL N/A 09/23/2019   Procedure: COLONOSCOPY WITH PROPOFOL;  Surgeon: [name], MD;  Location: [facility] ENDOSCOPY;  Service: Gastroenterology;  Laterality: N/A;   COLONOSCOPY WITH PROPOFOL N/A 04/06/2020   Procedure: COLONOSCOPY WITH PROPOFOL;  Surgeon: [name], MD;  Location: [facility] ENDOSCOPY;  Service: Gastroenterology;  Laterality: N/A;    OB History   No obstetric history on file.      Home Medications    Prior to Admission medications   Medication Sig Start Date End Date Taking? Authorizing Provider  atorvastatin (LIPITOR) 20 MG tablet Take 20 mg by mouth daily at 6 PM.  06/23/17  Yes [provider]  estradiol (ESTRACE) 1 MG tablet Take 1 mg by mouth daily. 07/20/17  Yes [provider]  FLUoxetine (PROZAC) 20 MG tablet Take 20 mg by mouth daily. 06/09/17  Yes [provider]  fluticasone (FLONASE) 50 MCG/ACT nasal spray Place 2 sprays into both nostrils daily. 08/08/22  Yes Rodriguez-Southworth, Nettie Elm, PA-C  Multiple Vitamin (MULTIVITAMIN) tablet Take 1 tablet by mouth daily.   Yes [provider]  baclofen (LIORESAL) 10 MG tablet Take 10 mg by mouth 3 (three) times daily as needed.    [provider]  cefadroxil (DURICEF) 500 MG capsule Take 2 capsules (1,000 mg total) by mouth 2 (two) times daily for 7 days. 09/10/23 09/17/23  Loleta Rose, MD  dicyclomine (BENTYL) 10 MG capsule TAKE 1 CAPSULE (10 MG TOTAL) BY MOUTH 4 TIMES A DAY BEFORE MEALS AND AT BEDTIME 03/20/23   [name], MD  methylPREDNISolone (MEDROL DOSEPAK) 4 MG TBPK tablet Take as directed 03/14/23   Rodriguez-Southworth, Nettie Elm, PA-C  cetirizine (ZYRTEC) 10 MG tablet Take 10 mg by mouth daily.   03/05/20  [provider]    Family History Family History  Problem Relation Age of Onset   Breast cancer Mother 16   Hypertension Mother    Lymphoma Father     Social History Social History   Tobacco Use   Smoking status: Never   Smokeless tobacco: Never  Vaping Use   Vaping  status: Never Used  Substance Use Topics   Alcohol use: No   Drug use: No     Allergies   Cipro [ciprofloxacin-ciproflox hcl er] and Macrodantin [nitrofurantoin macrocrystal]   Review of Systems Review of Systems  Constitutional:  Positive for fatigue. Negative for chills, diaphoresis and fever.  HENT:  Positive for congestion. Negative for sore throat.   Respiratory:  Positive for cough. Negative for chest tightness, shortness of breath and wheezing.   Cardiovascular:  Negative for chest pain.  Gastrointestinal:  Negative for abdominal pain, diarrhea, nausea and vomiting.   Genitourinary:  Positive for dysuria, frequency and urgency. Negative for decreased urine volume, flank pain, hematuria, pelvic pain, vaginal bleeding, vaginal discharge and vaginal pain.  Musculoskeletal:  Negative for back pain.  Skin:  Negative for rash.  Neurological:  Positive for weakness. Negative for headaches.     Physical Exam Triage Vital Signs ED Triage Vitals  Encounter Vitals Group     BP      Systolic BP Percentile      Diastolic BP Percentile      Pulse      Resp      Temp      Temp src      SpO2      Weight      Height      Head Circumference      Peak Flow      Pain Score      Pain Loc      Pain Education      Exclude from Growth Chart    No data found.  Updated Vital Signs BP 102/71 (BP Location: Left Arm)   Pulse 87   Temp 97.9 F (36.6 C) (Oral)   Ht 5\' 3"  (1.6 m)   Wt 134 lb (60.8 kg)   SpO2 91%   BMI 23.74 kg/m    Physical Exam Vitals and nursing note reviewed.  Constitutional:      General: She is in acute distress.     Appearance: Normal appearance. She is ill-appearing and toxic-appearing.     Comments: Pale skin, purple lips. Warm extremities, reduced cap refill of toes. Increased RR. Closes eyes frequently and talks about how tired she is. Pauses for breaths between sentences but adamantly denies SOB.  HENT:     Head: Normocephalic and atraumatic.     Nose: Nose normal.     Mouth/Throat:     Mouth: Mucous membranes are moist.     Pharynx: Oropharynx is clear.  Eyes:     General: No scleral icterus.       Right eye: No discharge.        Left eye: No discharge.     Conjunctiva/sclera: Conjunctivae normal.  Cardiovascular:     Rate and Rhythm: Normal rate and regular rhythm.     Heart sounds: Normal heart sounds.  Pulmonary:     Effort: Respiratory distress present.     Breath sounds: Normal breath sounds. No wheezing or rhonchi.  Abdominal:     Palpations: Abdomen is soft.     Tenderness: There is no abdominal tenderness.  There is no right CVA tenderness or left CVA tenderness.  Musculoskeletal:     Cervical back: Neck supple.  Skin:    General: Skin is dry.  Neurological:     General: No focal deficit present.     Mental Status: She is alert. Mental status is at baseline.     Motor: No weakness.  Gait: Gait normal.  Psychiatric:        Mood and Affect: Mood normal.      UC Treatments / Results  Labs (all labs ordered are listed, but only abnormal results are displayed) Labs Reviewed  URINALYSIS, W/ REFLEX TO CULTURE (INFECTION SUSPECTED) - Abnormal; Notable for the following components:      Result Value   Color, Urine ORANGE (*)    APPearance HAZY (*)    Glucose, UA   (*)    Value: TEST NOT REPORTED DUE TO COLOR INTERFERENCE OF URINE PIGMENT   Hgb urine dipstick   (*)    Value: TEST NOT REPORTED DUE TO COLOR INTERFERENCE OF URINE PIGMENT   Bilirubin Urine   (*)    Value: TEST NOT REPORTED DUE TO COLOR INTERFERENCE OF URINE PIGMENT   Ketones, ur   (*)    Value: TEST NOT REPORTED DUE TO COLOR INTERFERENCE OF URINE PIGMENT   Protein, ur   (*)    Value: TEST NOT REPORTED DUE TO COLOR INTERFERENCE OF URINE PIGMENT   Nitrite   (*)    Value: TEST NOT REPORTED DUE TO COLOR INTERFERENCE OF URINE PIGMENT   Leukocytes,Ua   (*)    Value: TEST NOT REPORTED DUE TO COLOR INTERFERENCE OF URINE PIGMENT   Bacteria, UA MANY (*)    All other components within normal limits    EKG   Radiology No results found.  Procedures Procedures (including critical care time)  Medications Ordered in UC Medications  sodium chloride 0.9 % bolus 1,000 mL (1,000 mLs Intravenous New Bag/Given 09/10/23 1151)    Initial Impression / Assessment and Plan / UC Course  I have reviewed the triage vital signs and the nursing notes.  Pertinent labs & imaging results that were available during my care of the patient were reviewed by me and considered in my medical decision making (see chart for details).  77 year old  female presents for dysuria, frequency, urgency, fatigue, cough x 10 days with recent worsening of symptoms.  She denies fever or shortness of breath but is visibly short of breath and distressed.  Her skin is pale.  Her lips are a bit purpleish discoloration.  Her extremities are warm but she has a reduced cap refill of toes.  Pauses to take breaths between sentences but adamantly denies being short of breath.  Chest clear to auscultation and heart regular rate and rhythm.  Abdomen soft and nontender.  BP 102/71.  Initial oxygen saturation is 83% but patient has gel nail polish.  Checked multiple sites including toes and earlobes.  Used 3 different pulse oximeters and had 2 other nursing staff use the oximeters to confirm. Highest oxygen saturation on room air was 88% (right earlobe) but it stayed consistently about 85%.  3 L supplemental O2 increased oxygen to 91%. Patient placed on 15 ml nonrebreather and oxygen increased to 96%.  A urinalysis was ran during visit.  UA with color interference due to the AZO.  However it does show many bacteria and clumps of white blood cells which is likely consistent with urinary tract infection.  Discussed with patient that I have a high suspicion for sepsis related to urinary tract infection.  Advised that she needs further workup in the emergency department as she is hypoxic.  She initially declines.  Then she agrees and would like her husband to take her.  Explained the risks of doing so.  Husband does not feel comfortable taking patient and I do  not think is good idea.  She eventually agrees to go by EMS.  EMS contacted and arrived swiftly.  Nursing staff started IV and began running normal saline bolus. Patient stable and in route to [facility]..   Acute respiratory failure with hypoxia Likely urosepsis Possible pneumonia as well  Patient has conditions which pose acute threat to life and bodily harm. Expect patient to be admitted to hospital.    Final Clinical  Impressions(s) / UC Diagnoses   Final diagnoses:  Acute respiratory failure with hypoxia (HCC)  Urinary tract infection without hematuria, site unspecified  Weakness  Acute cough     Discharge Instructions      You have been advised to follow up immediately in the emergency department for concerning signs.symptoms. If you declined EMS transport, please have a family member take you directly to the ED at this time. Do not delay. Based on concerns about condition, if you do not follow up in th e ED, you may risk poor outcomes including worsening of condition, delayed treatment and potentially life threatening issues. If you have declined to go to the ED at this time, you should call your PCP immediately to set up a follow up appointment.  Go to ED for red flag symptoms, including; fevers you cannot reduce with Tylenol/Motrin, severe headaches, vision changes, numbness/weakness in part of the body, lethargy, confusion, intractable vomiting, severe dehydration, chest pain, breathing difficulty, severe persistent abdominal or pelvic pain, signs of severe infection (increased redness, swelling of an area), feeling faint or passing out, dizziness, etc. You should especially go to the ED for sudden acute worsening of condition if you do not elect to go at this time.      ED Prescriptions   None    PDMP not reviewed this encounter.   Shirlee Latch, PA-C 09/10/23 [phone]

## 2023-09-10 NOTE — Discharge Instructions (Signed)

## 2023-09-10 NOTE — ED Notes (Signed)
Patient is being discharged from the Urgent Care and sent to the Emergency Department via ems . Per Eusebio Friendly, PA, patient is in need of higher level of care due to hypoxia O2 sat 83% RA. Patient is aware and verbalizes understanding of plan of care.  Vitals:   09/10/23 1112 09/10/23 1147  BP: 102/71   Pulse: 79 87  Temp: 97.9 F (36.6 C)   SpO2: (!) 83% 90%

## 2023-09-10 NOTE — ED Triage Notes (Signed)
Pt c/o urinary frequency and pain x10days  Pt states that she has been up every 2 hours trying to urinate  Pt declines feeling any SOB. Pt states that she feels tired and wants to sleep.

## 2023-09-10 NOTE — ED Provider Notes (Signed)
[facility] Provider Note    Event Date/Time   First MD Initiated Contact with Patient 09/10/23 1303     (approximate)   History   Dysuria   HPI Rebecca Morris is a 77 y.o. female who is generally healthy and active.  She presents by EMS from urgent care for evaluation of dysuria but also with concerns for hypoxia or sepsis.  The patient reports that she has had about 10 or 11 days of burning when she urinates and increased urinary frequency.  She does not typically have UTIs.  She went to an urgent care today and they were concerned to be because of her overall appearance (concern for particular perioral cyanosis) as well as for persistent hypoxia.  However the patient reports that she has not been feeling short of breath.  There was some concern that nail polish may be skewing the results of the SpO2 measurement, but they tried multiple locations with multiple machines and she was reading as persistently hypoxic.  Given concern for possible sepsis, she was transported by EMS to the emergency department.  The patient reports that she feels fine.  She says she only wanted some antibiotics for her dysuria and increased urinary frequency.  At no point has she feels short of breath.  She denies chest pain, fever, nausea, vomiting, abdominal pain.  She has no history of blood clots in the legs of the lungs and has had no recent surgeries nor immobilizations.     Physical Exam   Triage Vital Signs: ED Triage Vitals  Encounter Vitals Group     BP 09/10/23 1238 (!) 164/69     Systolic BP Percentile --      Diastolic BP Percentile --      Pulse Rate 09/10/23 1238 76     Resp 09/10/23 1238 16     Temp 09/10/23 1238 97.7 F (36.5 C)     Temp Source 09/10/23 1238 Oral     SpO2 09/10/23 1250 96 %     Weight 09/10/23 1233 60.7 kg (133 lb 13.1 oz)     Height 09/10/23 1233 1.6 m (5\' 3" )     Head Circumference --      Peak Flow --      Pain Score --      Pain Loc --       Pain Education --      Exclude from Growth Chart --     Most recent vital signs: Vitals:   09/10/23 1238 09/10/23 1250  BP: (!) 164/69   Pulse: 76   Resp: 16   Temp: 97.7 F (36.5 C)   SpO2:  96%    General: Awake, no distress.  Well-appearing although I can see that she has a somewhat purplish tent to her lips. CV:  Good peripheral perfusion.  Warm extremities, easily palpable radial pulse.  Normal heart sounds. Resp:  Normal effort. Speaking easily and comfortably, no accessory muscle usage nor intercostal retractions.  Lungs are clear to auscultation bilaterally. Abd:  No distention.  No tenderness palpation throughout the abdomen. Other:  Mood and affect are normal and appropriate.  Patient is in good spirits despite her history of present illness and the events from earlier today.  Husband is at bedside and is also equally appropriate and cheerful.   ED Results / Procedures / Treatments   Labs (all labs ordered are listed, but only abnormal results are displayed) Labs Reviewed  URINALYSIS, ROUTINE W REFLEX MICROSCOPIC -  Abnormal; Notable for the following components:      Result Value   Color, Urine AMBER (*)    APPearance HAZY (*)    Nitrite POSITIVE (*)    Leukocytes,Ua MODERATE (*)    Bacteria, UA FEW (*)    All other components within normal limits  URINE CULTURE     PROCEDURES:  Critical Care performed: No  Procedures    IMPRESSION / MDM / ASSESSMENT AND PLAN / ED COURSE  I reviewed the triage vital signs and the nursing notes.                              Differential diagnosis includes, but is not limited to, urinary tract infection, sepsis, PE, pneumonia.  Patient's presentation is most consistent with acute presentation with potential threat to life or bodily function.  Labs/studies ordered: Urinalysis, urine culture  Interventions/Medications given:  Medications  cefTRIAXone (ROCEPHIN) 1 g in sodium chloride 0.9 % 100 mL IVPB (has no  administration in time range)    (Note:  hospital course my include additional interventions and/or labs/studies not listed above.)   I read the note from the urgent care and understand the concerns from the provider at that facility.  However, the patient's vital signs have been very appropriate in our emergency department including no evidence of hypoxia.  The patient is generally well-appearing other than the slight purpleish tent or cast to her lips.  She reports not having any systemic symptoms, just the persistent dysuria and increased urinary frequency.  I had an open and honest discussion with the patient and I offered to check lab work for possible sepsis, evaluate with a chest x-ray, etc., but I explained that I think that erring on the side of caution was appropriate but at this point I do not see any evidence of systemic illness.  She would prefer to be treated as an outpatient for her urinary tract infection (urinalysis was positive) and not pursue aggressive workup.  Given stability of her appearance and vital signs, I think that is reasonable and appropriate.  Given that she does have a very positive urinalysis and that it may be difficult for her to get antibiotics from pharmacy on a Sunday afternoon, I treating her with ceftriaxone 1 g IV (IV was placed by EMS) to get her started with parental antibiotics and she can follow-up with cefadroxil as an outpatient   I gave my usual and customary return precautions.  Patient is stable with no indication for admission and appropriate for discharge and outpatient follow-up.       FINAL CLINICAL IMPRESSION(S) / ED DIAGNOSES   Final diagnoses:  Acute cystitis without hematuria     Rx / DC Orders   ED Discharge Orders          Ordered    cefadroxil (DURICEF) 500 MG capsule  2 times daily        10 /13/24 1455             Note:  This document was prepared using Dragon voice recognition software and may include  unintentional dictation errors.   Loleta Rose, MD 09/10/23 1455

## 2023-09-11 LAB — URINE CULTURE

## 2023-09-19 ENCOUNTER — Other Ambulatory Visit: Payer: Self-pay | Admitting: Gastroenterology

## 2023-09-19 DIAGNOSIS — R1032 Left lower quadrant pain: Secondary | ICD-10-CM

## 2023-12-07 ENCOUNTER — Other Ambulatory Visit: Payer: Self-pay | Admitting: Certified Nurse Midwife

## 2023-12-07 DIAGNOSIS — Z1231 Encounter for screening mammogram for malignant neoplasm of breast: Secondary | ICD-10-CM

## 2024-03-16 ENCOUNTER — Other Ambulatory Visit: Payer: Self-pay | Admitting: Gastroenterology

## 2024-03-16 DIAGNOSIS — R1032 Left lower quadrant pain: Secondary | ICD-10-CM

## 2024-08-02 ENCOUNTER — Emergency Department

## 2024-08-02 ENCOUNTER — Other Ambulatory Visit: Payer: Self-pay

## 2024-08-02 ENCOUNTER — Emergency Department
Admission: EM | Admit: 2024-08-02 | Discharge: 2024-08-02 | Disposition: A | Discharge status: Home / Self Care | Attending: Emergency Medicine | Admitting: Emergency Medicine

## 2024-08-02 DIAGNOSIS — E875 Hyperkalemia: Secondary | ICD-10-CM | POA: Diagnosis not present

## 2024-08-02 DIAGNOSIS — R42 Dizziness and giddiness: Secondary | ICD-10-CM | POA: Diagnosis present

## 2024-08-02 DIAGNOSIS — R0602 Shortness of breath: Secondary | ICD-10-CM | POA: Insufficient documentation

## 2024-08-02 LAB — COMPREHENSIVE METABOLIC PANEL WITH GFR
ALT: 30 U/L (ref 0–44)
AST: 34 U/L (ref 15–41)
Albumin: 4.1 g/dL (ref 3.5–5.0)
Alkaline Phosphatase: 49 U/L (ref 38–126)
Anion gap: 14 (ref 5–15)
BUN: 26 mg/dL — ABNORMAL HIGH (ref 8–23)
CO2: 26 mmol/L (ref 22–32)
Calcium: 9.9 mg/dL (ref 8.9–10.3)
Chloride: 99 mmol/L (ref 98–111)
Creatinine, Ser: 0.71 mg/dL (ref 0.44–1.00)
GFR, Estimated: >60 mL/min (ref >60)
Glucose, Bld: 106 mg/dL — ABNORMAL HIGH (ref 70–99)
Potassium: 5.3 mmol/L — ABNORMAL HIGH (ref 3.5–5.1)
Sodium: 139 mmol/L (ref 135–145)
Total Bilirubin: 0.3 mg/dL (ref 0.0–1.2)
Total Protein: 7.9 g/dL (ref 6.5–8.1)

## 2024-08-02 LAB — CBC WITH DIFFERENTIAL/PLATELET
Abs Immature Granulocytes: 0.03 K/uL (ref 0.00–0.07)
Basophils Absolute: 0.1 K/uL (ref 0.0–0.1)
Basophils Relative: 1 %
Eosinophils Absolute: 0.1 K/uL (ref 0.0–0.5)
Eosinophils Relative: 1 %
HCT: 41.8 % (ref 36.0–46.0)
Hemoglobin: 13.4 g/dL (ref 12.0–15.0)
Immature Granulocytes: 0 %
Lymphocytes Relative: 19 %
Lymphs Abs: 2 K/uL (ref 0.7–4.0)
MCH: 32.8 pg (ref 26.0–34.0)
MCHC: 32.1 g/dL (ref 30.0–36.0)
MCV: 102.5 fL — ABNORMAL HIGH (ref 80.0–100.0)
Monocytes Absolute: 0.9 K/uL (ref 0.1–1.0)
Monocytes Relative: 9 %
Neutro Abs: 7.4 K/uL (ref 1.7–7.7)
Neutrophils Relative %: 70 %
Platelets: 350 K/uL (ref 150–400)
RBC: 4.08 MIL/uL (ref 3.87–5.11)
RDW: 14.7 % (ref 11.5–15.5)
WBC: 10.5 K/uL (ref 4.0–10.5)
nRBC: 0 % (ref 0.0–0.2)

## 2024-08-02 MED ORDER — ACETAMINOPHEN 500 MG PO TABS
1000.0000 mg | ORAL_TABLET | Freq: Once | ORAL | Status: AC
  Administered 2024-08-02: 1000 mg via ORAL
  Filled 2024-08-02: qty 2

## 2024-08-02 MED ORDER — IOHEXOL 350 MG/ML SOLN
75.0000 mL | Freq: Once | INTRAVENOUS | Status: AC | PRN
  Administered 2024-08-02: 75 mL via INTRAVENOUS

## 2024-08-02 MED ORDER — SODIUM CHLORIDE 0.9 % IV SOLN: Freq: Once | INTRAVENOUS | Status: AC

## 2024-08-02 NOTE — ED Triage Notes (Addendum)
 Pt comes with two weeks of weakness and dizziness. Pt states it isn't getting any better. Pt states no N/V/D pt stats some blurry vision from dizziness.

## 2024-08-02 NOTE — ED Provider Notes (Signed)
 [facility] Provider Note    Event Date/Time   First MD Initiated Contact with Patient 08/02/24 1344     (approximate)   History   Weakness   HPI  Rebecca Morris is a 78 y.o. female with history of anxiety diverticulosis, hyperlipidemia who presents with complaints of dizziness, fatigue, weakness over the last 2 weeks. she reports that she becomes quite exhausted with exertion that she can typically do.  For example today she walked into the clinic from the parking lot and her husband reports she was quite winded afterwards.  She also describes that when she walks up the stairs she has to sit down and rest because she feels exhausted and occasionally feels dizzy, which she describes as lightheaded.  She denies neurodeficits.     Physical Exam   Triage Vital Signs: ED Triage Vitals  Encounter Vitals Group     BP 08/02/24 1126 (!) 180/64     Girls Systolic BP Percentile --      Girls Diastolic BP Percentile --      Boys Systolic BP Percentile --      Boys Diastolic BP Percentile --      Pulse Rate 08/02/24 1126 99     Resp 08/02/24 1126 18     Temp 08/02/24 1126 98 F (36.7 C)     Temp src --      SpO2 08/02/24 1126 93 %     Weight 08/02/24 1125 68 kg (150 lb)     Height 08/02/24 1125 1.626 m (5' 4)     Head Circumference --      Peak Flow --      Pain Score 08/02/24 1125 4     Pain Loc --      Pain Education --      Exclude from Growth Chart --     Most recent vital signs: Vitals:   08/02/24 1126  BP: (!) 180/64  Pulse: 99  Resp: 18  Temp: 98 F (36.7 C)  SpO2: 93%     General: Awake, no distress.  Overall well-appearing CV:  Good peripheral perfusion.  Regular rate and rhythm Resp:  Normal effort.  Clear to auscultation Abd:  No distention.  Soft, nontender Other:  No calf pain or swelling   ED Results / Procedures / Treatments   Labs (all labs ordered are listed, but only abnormal results are displayed) Labs Reviewed  CBC  WITH DIFFERENTIAL/PLATELET - Abnormal; Notable for the following components:      Result Value   MCV 102.5 (*)    All other components within normal limits  COMPREHENSIVE METABOLIC PANEL WITH GFR - Abnormal; Notable for the following components:   Potassium 5.3 (*)    Glucose, Bld 106 (*)    BUN 26 (*)    All other components within normal limits  PROTIME-INR  APTT     EKG  ED ECG REPORT I, Lamar Price, the attending physician, personally viewed and interpreted this ECG.  Date: 08/02/2024  Rhythm: normal sinus rhythm QRS Axis: normal Intervals: normal ST/T Wave abnormalities: normal Narrative Interpretation: no evidence of acute ischemia    RADIOLOGY CT head without acute abnormality    PROCEDURES:  Critical Care performed:   Procedures   MEDICATIONS ORDERED IN ED: Medications  0.9 %  sodium chloride  infusion ( Intravenous New Bag/Given 08/02/24 1416)  acetaminophen  (TYLENOL ) tablet 1,000 mg (1,000 mg Oral Given 08/02/24 1435)  iohexol  (OMNIPAQUE ) 350 MG/ML injection 75 mL (  75 mLs Intravenous Contrast Given 08/02/24 1451)     IMPRESSION / MDM / ASSESSMENT AND PLAN / ED COURSE  I reviewed the triage vital signs and the nursing notes. Patient's presentation is most consistent with acute presentation with potential threat to life or bodily function.  Patient presents with symptoms as above, differential is fairly extensive, given her description of shortness of breath and lightheadedness with exertion and presentation with mild tachycardia SpO2 of 93% suspicious for possible PE.  Less consistent with CVA, neuroexam is reassuring, CT head obtained from triage which is unremarkable  Lab work reviewed, notable for very minimal hyperkalemia  Rebecca send for CT angiography  CT scan is overall reassuring, I suspect cardiac etiology of shortness of breath, lightheadedness, however given reassuring workup here, no indication for admission, Rebecca place urgent cardiology  referral, she and her husband agree with this plan.      FINAL CLINICAL IMPRESSION(S) / ED DIAGNOSES   Final diagnoses:  Lightheadedness  Shortness of breath     Rx / DC Orders   ED Discharge Orders          Ordered    Ambulatory referral to Cardiology       Comments: If you have not heard from the Cardiology office within the next 72 hours please call [phone].   08/02/24 1534             Note:  This document was prepared using Dragon voice recognition software and may include unintentional dictation errors.   Arlander Charleston, MD 08/02/24 1535

## 2024-08-26 ENCOUNTER — Ambulatory Visit

## 2024-08-26 VITALS — BP 136/80 | HR 88 | Ht 65.0 in | Wt 155.0 lb

## 2024-08-26 DIAGNOSIS — E782 Mixed hyperlipidemia: Secondary | ICD-10-CM

## 2024-08-26 DIAGNOSIS — R0609 Other forms of dyspnea: Secondary | ICD-10-CM | POA: Diagnosis not present

## 2024-08-26 DIAGNOSIS — I2510 Atherosclerotic heart disease of native coronary artery without angina pectoris: Secondary | ICD-10-CM | POA: Diagnosis not present

## 2024-08-26 DIAGNOSIS — R072 Precordial pain: Secondary | ICD-10-CM

## 2024-08-26 DIAGNOSIS — Z7189 Other specified counseling: Secondary | ICD-10-CM | POA: Diagnosis not present

## 2024-08-26 DIAGNOSIS — Z79899 Other long term (current) drug therapy: Secondary | ICD-10-CM

## 2024-08-26 MED ORDER — ASPIRIN 81 MG PO TBEC: 81.0000 mg | DELAYED_RELEASE_TABLET | Freq: Every day | ORAL | Status: DC

## 2024-08-26 MED ORDER — METOPROLOL TARTRATE 100 MG PO TABS: ORAL_TABLET | ORAL | 0 refills | Status: DC

## 2024-08-26 NOTE — Progress Notes (Signed)
  Cardiology Office Note   Date:  08/26/2024  ID:  Rebecca Morris, Rebecca Morris January 16, 1946, MRN [id number] PCP: Johnie Perkins, DEVONNA  [facility] Providers Cardiologist:  Caron Poser, MD     History of Present Illness Krissie J Klinck is a 78 y.o. female PMH HLD who presents for further evaluation management of lightheadedness and DOE.  Patient presents with 1 month history of weakness and exhaustion.  She reports extreme fatigue with minimal exertion.  She also does feel short of breath during these periods.  She denies any chest discomfort.  She denies any lower extremity edema.  She reports she can lay flat at night without dyspnea.  Last LDL 56 03/2024  Relevant CVD History -Moderate CAC LCx and LAD CTPA 08/02/2024   ROS: Pt denies any chest discomfort, jaw pain, arm pain, palpitations, syncope, presyncope, orthopnea, PND, or LE edema.  Studies Reviewed I have independently reviewed the patient's ECG, recent CT scan, recent medical records, and blood work.  Physical Exam VS:  BP 136/80 (BP Location: Right Arm, Patient Position: Sitting, Cuff Size: Normal)   Pulse 88   Ht 5' 5 (1.651 m)   Wt 155 lb (70.3 kg)   SpO2 96%   BMI 25.79 kg/m        Wt Readings from Last 3 Encounters:  08/26/24 155 lb (70.3 kg)  08/02/24 150 lb (68 kg)  09/10/23 135 lb (61.2 kg)    GEN: No acute distress. NECK: No JVD; No carotid bruits. CARDIAC: RRR, 2/6 systolic murmur, no rubs, gallops. RESPIRATORY:  Clear to auscultation. EXTREMITIES:  Warm and well-perfused. No edema.  ASSESSMENT AND PLAN DOE Dizziness Patient presents with dyspnea that remains undifferentiated.  She had a CTPA in the ED on 08/02/2024 which did not show PE but did show mild CAC of the LAD and LCx.  She does have a murmur on exam.  She denies any orthopnea or chest discomfort.  More testing is indicated.  Plan: - Echocardiogram - Coronary CT angiogram given exertional component - Zio monitor to evaluate for arrhythmia  cause - I advised her to proceed to the ED should her symptoms worsen while we are waiting for her cardiac studies - PFTs - If the above studies are not suggestive of a cardiac etiology and her PFTs are abnormal, then we will refer her to pulmonology  CAC HLD Mild CAC seen on recent CTPA.  Last LDL 56 03/2024.  Plan: - Start ASA 81 mg daily - Continue Lipitor 20 mg daily        Dispo: RTC 2 months  Signed, Caron Poser, MD

## 2024-08-26 NOTE — Patient Instructions (Signed)
 Medication Instructions:  Your physician recommends the following medication changes.  START TAKING: Aspirin 81 mg by mouth daily (you may purchase this over the counter) Take all other medications as precribed  *If you need a refill on your cardiac medications before your next appointment, please call your pharmacy*  Lab Work: Your provider would like for you to have following labs drawn today BMET .   If you have labs (blood work) drawn today and your tests are completely normal, you will receive your results only by: MyChart Message (if you have MyChart) OR A paper copy in the mail If you have any lab test that is abnormal or we need to change your treatment, we will call you to review the results.  Testing/Procedures:  CORONARY CT SCAN:    Your cardiac CT will be scheduled at one of the below locations:   [facility] [address] [place], [place] [address] [phone]  Please arrive 15 mins early for check-in and test prep.  There is spacious parking and easy access to the radiology department from the [facility] entrance. Please enter here and check-in with the desk attendant.   Please follow these instructions carefully (unless otherwise directed):  An IV will be required for this test and Nitroglycerin will be given.   On the Night Before the Test: Be sure to Drink plenty of water. Do not consume any caffeinated/decaffeinated beverages or chocolate 12 hours prior to your test. Do not take any antihistamines 12 hours prior to your test.   On the Day of the Test: Drink plenty of water until 1 hour prior to the test. Do not eat any food 1 hour prior to test. You may take your regular medications prior to the test.  Take metoprolol (Lopressor) 100 mg two hours prior to test. FEMALES- please wear underwire-free bra if available, avoid dresses & tight clothing      After the Test: Drink plenty of water. After receiving IV  contrast, you may experience a mild flushed feeling. This is normal. On occasion, you may experience a mild rash up to 24 hours after the test. This is not dangerous. If this occurs, you can take Benadryl 25 mg, Zyrtec, Claritin, or Allegra and increase your fluid intake. (Patients taking Tikosyn should avoid Benadryl, and may take Zyrtec, Claritin, or Allegra) If you experience trouble breathing, this can be serious. If it is severe call 911 IMMEDIATELY. If it is mild, please call our office.  We will call to schedule your test 2-4 weeks out understanding that some insurance companies will need an authorization prior to the service being performed.   For more information and frequently asked questions, please visit our website : [email or web address]  For non-scheduling related questions, please contact the cardiac imaging nurse navigator should you have any questions/concerns: Cardiac Imaging Nurse Navigators Direct Office Dial: [phone]   For scheduling needs, including cancellations and rescheduling, please call [place], [phone].    ECHOCARDIOGRAM  Your physician has requested that you have an echocardiogram. Echocardiography is a painless test that uses sound waves to create images of your heart. It provides your doctor with information about the size and shape of your heart and how well your heart's chambers and valves are working.   You may receive an ultrasound enhancing agent through an IV if needed to better visualize your heart during the echo. This procedure takes approximately one hour.  There are no restrictions for this procedure.  This will  take place at [address] [facility] [address] [facility], [place] [address]  Please note: We ask at that you not bring children with you during ultrasound (echo/ vascular) testing. Due to room size and safety concerns, children are not allowed in the ultrasound rooms during exams. Our front office staff cannot  provide observation of children in our lobby area while testing is being conducted. An adult accompanying a patient to their appointment will only be allowed in the ultrasound room at the discretion of the ultrasound technician under special circumstances. We apologize for any inconvenience.    ZIO XT- Long Term Monitor Instructions  Your physician has requested you wear a ZIO patch monitor for 14 days.  This is a single patch monitor. [employer] supplies one patch monitor per enrollment. Additional stickers are not available. Please do not apply patch if you will be having a Nuclear Stress Test,  Echocardiogram, Cardiac CT, MRI, or Chest Xray during the period you would be wearing the  monitor. The patch cannot be worn during these tests. You cannot remove and re-apply the  ZIO XT patch monitor.  Your ZIO patch monitor will be mailed 3 day USPS to your address on file. It may take 3-5 days  to receive your monitor after you have been enrolled.  Once you have received your monitor, please review the enclosed instructions. Your monitor  has already been registered assigning a specific monitor serial # to you.  Billing and Patient Assistance Program Information  We have supplied [employer] with any of your insurance information on file for billing purposes. [employer] offers a sliding scale Patient Assistance Program for patients that do not have  insurance, or whose insurance does not completely cover the cost of the ZIO monitor.  You must apply for the Patient Assistance Program to qualify for this discounted rate.  To apply, please call [employer] at [phone], select option 4, select option 2, ask to apply for  Patient Assistance Program. Meredeth will ask your household income, and how many people  are in your household. They will quote your out-of-pocket cost based on that information.  [employer] will also be able to set up a 66-month, interest-free payment plan if needed.  Applying the monitor    Shave hair from upper left chest.  Hold abrader disc by orange tab. Rub abrader in 40 strokes over the upper left chest as  indicated in your monitor instructions.  Clean area with 4 enclosed alcohol pads. Let dry.  Apply patch as indicated in monitor instructions. Patch will be placed under collarbone on left  side of chest with arrow pointing upward.  Rub patch adhesive wings for 2 minutes. Remove white label marked 1. Remove the white  label marked 2. Rub patch adhesive wings for 2 additional minutes.  While looking in a mirror, press and release button in center of patch. A small green light will  flash 3-4 times. This will be your only indicator that the monitor has been turned on.   AFTER APPLYING MONITOR: Do not shower for the first 24 hours. You may shower after the first 24 hours.  Press the button if you feel a symptom. You will hear a small click. Record Date, Time and  Symptom in the Patient Logbook.   AFTER 14 DAYS: When you are ready to remove the patch, follow instructions on the last 2 pages of Patient  Logbook. Stick patch monitor onto the last page of Patient Logbook.  Place Patient Logbook in the  blue and white box. Use locking tab on box and tape box closed  securely. The blue and white box has prepaid postage on it. Please place it in the mailbox as  soon as possible. Your physician should have your test results approximately 7 days after the  monitor has been mailed back to [facility].  Call [facility] Customer Care at [phone] if you have questions regarding  your ZIO XT patch monitor. Call them immediately if you see an orange light blinking on your  monitor.  If your monitor falls off in less than 4 days, contact our Monitor department at [phone].  If your monitor becomes loose or falls off after 4 days call [employer] at [phone] for  suggestions on securing your monitor   PULMONARY FUNCTION TEST  Your provider has ordered a PFT.  This will take place at [facility]. Please go to the [facility] and check in at the registration desk.  The test will last approximately one hour No smoking at least one hour prior to the test No alcohol 4 hours prior to the test Do not wear restrictive clothing to the test Do not use Albuterol or Xopenox or any other nebulizer medications or inhalers 4 hours prior to the test. No caffeine.   Follow-Up: At [facility], you and your health needs are our priority.  As part of our continuing mission to provide you with exceptional heart care, our providers are all part of one team.  This team includes your primary Cardiologist (physician) and Advanced Practice Providers or APPs (Physician Assistants and Nurse Practitioners) who all work together to provide you with the care you need, when you need it.  Your next appointment:   2 month(s)   Provider:  Caron Poser, MD  We recommend signing up for the patient portal called MyChart.  Sign up information is provided on this After Visit Summary.  MyChart is used to connect with patients for Virtual Visits (Telemedicine).  Patients are able to view lab/test results, encounter notes, upcoming appointments, etc.  Non-urgent messages can be sent to your provider as well.   To learn more about what you can do with MyChart, go to [email or web address].

## 2024-08-27 ENCOUNTER — Ambulatory Visit: Payer: Self-pay

## 2024-08-27 LAB — BASIC METABOLIC PANEL WITH GFR
BUN/Creatinine Ratio: 38 — ABNORMAL HIGH (ref 12–28)
BUN: 26 mg/dL (ref 8–27)
CO2: 22 mmol/L (ref 20–29)
Calcium: 10 mg/dL (ref 8.7–10.3)
Chloride: 102 mmol/L (ref 96–106)
Creatinine, Ser: 0.69 mg/dL (ref 0.57–1.00)
Glucose: 75 mg/dL (ref 70–99)
Potassium: 5.3 mmol/L — ABNORMAL HIGH (ref 3.5–5.2)
Sodium: 138 mmol/L (ref 134–144)
eGFR: 90 mL/min/1.73 (ref >59)

## 2024-09-04 ENCOUNTER — Other Ambulatory Visit

## 2024-09-13 ENCOUNTER — Telehealth (HOSPITAL_COMMUNITY): Payer: Self-pay | Admitting: Emergency Medicine

## 2024-09-13 NOTE — Telephone Encounter (Signed)
 Unable to leave vm [place] RN Navigator Cardiac Imaging [facility] V[facility] Services [phone] Office  [phone] Cell

## 2024-09-16 ENCOUNTER — Ambulatory Visit
Admission: RE | Admit: 2024-09-16 | Discharge: 2024-09-16 | Disposition: A | Discharge status: Home / Self Care | Source: Ambulatory Visit

## 2024-09-16 ENCOUNTER — Other Ambulatory Visit: Payer: Self-pay

## 2024-09-16 DIAGNOSIS — R072 Precordial pain: Secondary | ICD-10-CM | POA: Diagnosis present

## 2024-09-16 DIAGNOSIS — I2510 Atherosclerotic heart disease of native coronary artery without angina pectoris: Secondary | ICD-10-CM | POA: Diagnosis not present

## 2024-09-16 DIAGNOSIS — R931 Abnormal findings on diagnostic imaging of heart and coronary circulation: Secondary | ICD-10-CM | POA: Diagnosis not present

## 2024-09-16 MED ORDER — METOPROLOL TARTRATE 5 MG/5ML IV SOLN
10.0000 mg | Freq: Once | INTRAVENOUS | Status: AC | PRN
  Administered 2024-09-16: 10 mg via INTRAVENOUS

## 2024-09-16 MED ORDER — METOPROLOL TARTRATE 5 MG/5ML IV SOLN
INTRAVENOUS | Status: AC
  Filled 2024-09-16: qty 10

## 2024-09-16 MED ORDER — IOHEXOL 350 MG/ML SOLN
100.0000 mL | Freq: Once | INTRAVENOUS | Status: AC | PRN
  Administered 2024-09-16: 100 mL via INTRAVENOUS

## 2024-09-16 MED ORDER — DILTIAZEM HCL 25 MG/5ML IV SOLN: 10.0000 mg | INTRAVENOUS | Status: DC | PRN

## 2024-09-16 MED ORDER — NITROGLYCERIN 0.4 MG SL SUBL
0.8000 mg | SUBLINGUAL_TABLET | Freq: Once | SUBLINGUAL | Status: AC
  Administered 2024-09-16: 0.8 mg via SUBLINGUAL
  Filled 2024-09-16: qty 25

## 2024-09-16 NOTE — Progress Notes (Signed)
 Patient tolerated CT well. Vital signs stable encourage to drink water throughout day.Reasons explained and verbalized understanding. Ambulated steady gait.

## 2024-09-25 ENCOUNTER — Other Ambulatory Visit: Payer: Self-pay | Admitting: Otolaryngology

## 2024-09-25 DIAGNOSIS — J343 Hypertrophy of nasal turbinates: Secondary | ICD-10-CM

## 2024-10-04 ENCOUNTER — Ambulatory Visit
Admission: RE | Admit: 2024-10-04 | Discharge: 2024-10-04 | Disposition: A | Discharge status: Home / Self Care | Source: Ambulatory Visit | Attending: Otolaryngology | Admitting: Otolaryngology

## 2024-10-04 DIAGNOSIS — J343 Hypertrophy of nasal turbinates: Secondary | ICD-10-CM

## 2024-10-08 ENCOUNTER — Telehealth: Payer: Self-pay

## 2024-10-08 ENCOUNTER — Ambulatory Visit

## 2024-10-08 DIAGNOSIS — R0609 Other forms of dyspnea: Secondary | ICD-10-CM

## 2024-10-08 DIAGNOSIS — R072 Precordial pain: Secondary | ICD-10-CM

## 2024-10-08 LAB — ECHOCARDIOGRAM COMPLETE
AR max vel: 2.1 cm2
AV Area VTI: 2.05 cm2
AV Area mean vel: 2.13 cm2
AV Mean grad: 4 mmHg
AV Peak grad: 8.2 mmHg
Ao pk vel: 1.43 m/s
Area-P 1/2: 3.53 cm2
S' Lateral: 2.5 cm

## 2024-10-08 NOTE — Telephone Encounter (Signed)
Pt returning call regarding Echo results. Please advise

## 2024-10-08 NOTE — Telephone Encounter (Signed)
 Attempted to call pt back to give results; no answer; requested pt call our office back at her convenience to discuss the results of her echocardiogram

## 2024-10-09 NOTE — Telephone Encounter (Signed)
 See result echocardiogram result note.

## 2024-10-10 ENCOUNTER — Ambulatory Visit

## 2024-10-18 ENCOUNTER — Other Ambulatory Visit: Payer: Self-pay | Admitting: Otolaryngology

## 2024-10-18 DIAGNOSIS — J343 Hypertrophy of nasal turbinates: Secondary | ICD-10-CM

## 2024-10-28 ENCOUNTER — Other Ambulatory Visit: Payer: Self-pay | Admitting: Otolaryngology

## 2024-10-31 ENCOUNTER — Ambulatory Visit

## 2024-11-11 ENCOUNTER — Encounter: Payer: Self-pay | Admitting: Otolaryngology

## 2024-11-11 NOTE — Discharge Instructions (Signed)

## 2024-11-11 NOTE — Anesthesia Preprocedure Evaluation (Signed)
 Anesthesia Evaluation    Airway Mallampati: III  TM Distance: <3 FB Neck ROM: Full    Dental no notable dental hx.    Pulmonary    Pulmonary exam normal breath sounds clear to auscultation       Cardiovascular Normal cardiovascular exam+ Valvular Problems/Murmurs  Rhythm:Regular Rate:Normal  FINDINGS:  1. LAD: CT FFR of 0.81 suggesting low likelihood of hemodynamic  significance.   1. CT FFR analysis didn't suggest any significant stenosis.  Electronically Signed    By: Caron Poser    On: 09/16/2024 15:30   10-08-24 echo Ef 60-65%, normal biventricular function, mild MR, mild TR, trivia TR  CTPA in the ED on 08/02/2024 which did not show PE but did show mild CAC of the LAD and LCx.     Neuro/Psych  Headaches PSYCHIATRIC DISORDERS Anxiety Depression     Neuromuscular disease    GI/Hepatic   Endo/Other    Renal/GU      Musculoskeletal   Abdominal   Peds  Hematology   Anesthesia Other Findings  Patient reports being really dizzy from the sinus problem today Very tense and anxious today  Hyperlipidemia  Diverticulosis Anxiety  Mild mitral regurgitation by prior echocardiogram Mild tricuspid regurgitation by prior echocardiogram  Systolic murmur Chronic headaches  Fatigue Depression  Chronic allergic rhinitis Chronic right-sided low back pain with right-sided sciatica     Reproductive/Obstetrics                              Anesthesia Physical Anesthesia Plan  ASA: 3  Anesthesia Plan: General ETT   Post-op Pain Management:    Induction: Intravenous  PONV Risk Score and Plan:   Airway Management Planned: Oral ETT  Additional Equipment:   Intra-op Plan:   Post-operative Plan: Extubation in OR  Informed Consent: I have reviewed the patients History and Physical, chart, labs and discussed the procedure including the risks, benefits and alternatives for the proposed  anesthesia with the patient or authorized representative who has indicated his/her understanding and acceptance.     Dental Advisory Given  Plan Discussed with: Anesthesiologist, CRNA and Surgeon  Anesthesia Plan Comments: (Patient consented for risks of anesthesia including but not limited to:  - adverse reactions to medications - damage to eyes, teeth, lips or other oral mucosa - nerve damage due to positioning  - sore throat or hoarseness - Damage to heart, brain, nerves, lungs, other parts of body or loss of life  Patient voiced understanding and assent.)         Anesthesia Quick Evaluation

## 2024-11-14 ENCOUNTER — Encounter: Payer: Self-pay | Admitting: Otolaryngology

## 2024-11-14 ENCOUNTER — Ambulatory Visit: Payer: Self-pay | Admitting: Anesthesiology

## 2024-11-14 ENCOUNTER — Encounter: Admission: RE | Disposition: A | Payer: Self-pay | Source: Home / Self Care | Attending: Otolaryngology

## 2024-11-14 ENCOUNTER — Other Ambulatory Visit: Payer: Self-pay

## 2024-11-14 ENCOUNTER — Ambulatory Visit
Admission: RE | Admit: 2024-11-14 | Discharge: 2024-11-14 | Disposition: A | Discharge status: Home / Self Care | Attending: Otolaryngology | Admitting: Otolaryngology

## 2024-11-14 DIAGNOSIS — G709 Myoneural disorder, unspecified: Secondary | ICD-10-CM | POA: Insufficient documentation

## 2024-11-14 DIAGNOSIS — J342 Deviated nasal septum: Secondary | ICD-10-CM | POA: Diagnosis present

## 2024-11-14 DIAGNOSIS — J323 Chronic sphenoidal sinusitis: Secondary | ICD-10-CM | POA: Insufficient documentation

## 2024-11-14 DIAGNOSIS — R011 Cardiac murmur, unspecified: Secondary | ICD-10-CM | POA: Diagnosis not present

## 2024-11-14 DIAGNOSIS — J322 Chronic ethmoidal sinusitis: Secondary | ICD-10-CM | POA: Insufficient documentation

## 2024-11-14 DIAGNOSIS — F418 Other specified anxiety disorders: Secondary | ICD-10-CM | POA: Diagnosis not present

## 2024-11-14 DIAGNOSIS — J343 Hypertrophy of nasal turbinates: Secondary | ICD-10-CM | POA: Diagnosis not present

## 2024-11-14 HISTORY — PX: SEPTOPLASTY: SHX2393

## 2024-11-14 HISTORY — PX: NASAL SINUS SURGERY: SHX719

## 2024-11-14 HISTORY — PX: TURBINATE REDUCTION: SHX6157

## 2024-11-14 HISTORY — PX: IMAGE GUIDED SINUS SURGERY: SHX6570

## 2024-11-14 SURGERY — SINUS SURGERY, WITH IMAGING GUIDANCE
Anesthesia: General | Site: Nose | Laterality: Left

## 2024-11-14 MED ORDER — ONDANSETRON HCL 4 MG/2ML IJ SOLN
INTRAMUSCULAR | Status: DC | PRN
  Administered 2024-11-14: 14:00:00 4 mg via INTRAVENOUS

## 2024-11-14 MED ORDER — LIDOCAINE-EPINEPHRINE 1 %-1:100000 IJ SOLN
INTRAMUSCULAR | Status: DC | PRN
  Administered 2024-11-14: 13:00:00 3 mL

## 2024-11-14 MED ORDER — PHENYLEPHRINE HCL 0.5 % NA SOLN
NASAL | Status: DC | PRN
  Administered 2024-11-14: 13:00:00 5 mL

## 2024-11-14 MED ORDER — ACETAMINOPHEN 10 MG/ML IV SOLN
1000.0000 mg | Freq: Once | INTRAVENOUS | Status: AC
  Administered 2024-11-14: 10:00:00 1000 mg via INTRAVENOUS

## 2024-11-14 MED ORDER — ALBUTEROL SULFATE (2.5 MG/3ML) 0.083% IN NEBU
INHALATION_SOLUTION | RESPIRATORY_TRACT | Status: AC
  Filled 2024-11-14: qty 3

## 2024-11-14 MED ORDER — CEPHALEXIN 500 MG PO CAPS: 500.0000 mg | ORAL_CAPSULE | Freq: Two times a day (BID) | ORAL | 0 refills | Status: AC

## 2024-11-14 MED ORDER — PHENYLEPHRINE HCL-NACL 20-0.9 MG/250ML-% IV SOLN
INTRAVENOUS | Status: AC
  Filled 2024-11-14: qty 250

## 2024-11-14 MED ORDER — DEXAMETHASONE SODIUM PHOSPHATE 4 MG/ML IJ SOLN
INTRAMUSCULAR | Status: DC | PRN
  Administered 2024-11-14: 13:00:00 4 mg via INTRAVENOUS

## 2024-11-14 MED ORDER — LACTATED RINGERS IV SOLN: INTRAVENOUS | Status: DC

## 2024-11-14 MED ORDER — DEXAMETHASONE SODIUM PHOSPHATE 4 MG/ML IJ SOLN
INTRAMUSCULAR | Status: AC
  Filled 2024-11-14: qty 1

## 2024-11-14 MED ORDER — OXYMETAZOLINE HCL 0.05 % NA SOLN: 2.0000 | Freq: Once | NASAL | Status: DC

## 2024-11-14 MED ORDER — ACETAMINOPHEN 10 MG/ML IV SOLN
INTRAVENOUS | Status: AC
  Filled 2024-11-14: qty 100

## 2024-11-14 MED ORDER — SUGAMMADEX SODIUM 200 MG/2ML IV SOLN
INTRAVENOUS | Status: DC | PRN
  Administered 2024-11-14: 14:00:00 200 mg via INTRAVENOUS

## 2024-11-14 MED ORDER — PROPOFOL 10 MG/ML IV BOLUS
INTRAVENOUS | Status: AC
  Filled 2024-11-14: qty 20

## 2024-11-14 MED ORDER — MIDAZOLAM HCL 5 MG/5ML IJ SOLN
INTRAMUSCULAR | Status: DC | PRN
  Administered 2024-11-14: 12:00:00 2 mg via INTRAVENOUS

## 2024-11-14 MED ORDER — CEFAZOLIN SODIUM-DEXTROSE 2-3 GM-%(50ML) IV SOLR
INTRAVENOUS | Status: AC
  Filled 2024-11-14: qty 50

## 2024-11-14 MED ORDER — OXYCODONE HCL 5 MG PO TABS: 10.0000 mg | ORAL_TABLET | Freq: Once | ORAL | Status: DC

## 2024-11-14 MED ORDER — SUGAMMADEX SODIUM 200 MG/2ML IV SOLN
INTRAVENOUS | Status: AC
  Filled 2024-11-14: qty 2

## 2024-11-14 MED ORDER — MIDAZOLAM HCL 2 MG/2ML IJ SOLN
INTRAMUSCULAR | Status: AC
  Filled 2024-11-14: qty 2

## 2024-11-14 MED ORDER — DEXTROSE 5 % IV SOLN
2000.0000 mg | Freq: Once | INTRAVENOUS | Status: AC
  Administered 2024-11-14: 13:00:00 2000 mg via INTRAVENOUS

## 2024-11-14 MED ORDER — ONDANSETRON HCL 4 MG/2ML IJ SOLN
INTRAMUSCULAR | Status: AC
  Filled 2024-11-14: qty 2

## 2024-11-14 MED ORDER — PHENYLEPHRINE HCL (PRESSORS) 10 MG/ML IV SOLN
INTRAVENOUS | Status: DC | PRN
  Administered 2024-11-14: 13:00:00 200 ug via INTRAVENOUS

## 2024-11-14 MED ORDER — PROPOFOL 10 MG/ML IV BOLUS
INTRAVENOUS | Status: DC | PRN
  Administered 2024-11-14: 14:00:00 50 mg via INTRAVENOUS
  Administered 2024-11-14: 13:00:00 150 mg via INTRAVENOUS

## 2024-11-14 MED ORDER — LIDOCAINE HCL (CARDIAC) PF 100 MG/5ML IV SOSY
PREFILLED_SYRINGE | INTRAVENOUS | Status: DC | PRN
  Administered 2024-11-14: 13:00:00 60 mg via INTRAVENOUS

## 2024-11-14 MED ORDER — FENTANYL CITRATE (PF) 100 MCG/2ML IJ SOLN
INTRAMUSCULAR | Status: DC | PRN
  Administered 2024-11-14 (×2): 50 ug via INTRAVENOUS

## 2024-11-14 MED ORDER — OXYMETAZOLINE HCL 0.05 % NA SOLN
NASAL | Status: AC
  Filled 2024-11-14: qty 30

## 2024-11-14 MED ORDER — ROCURONIUM BROMIDE 100 MG/10ML IV SOLN
INTRAVENOUS | Status: DC | PRN
  Administered 2024-11-14: 13:00:00 50 mg via INTRAVENOUS
  Administered 2024-11-14: 14:00:00 20 mg via INTRAVENOUS

## 2024-11-14 MED ORDER — ALBUTEROL SULFATE (2.5 MG/3ML) 0.083% IN NEBU
2.5000 mg | INHALATION_SOLUTION | Freq: Four times a day (QID) | RESPIRATORY_TRACT | Status: DC | PRN
  Administered 2024-11-14: 15:00:00 2.5 mg via RESPIRATORY_TRACT

## 2024-11-14 MED ORDER — PHENYLEPHRINE 80 MCG/ML (10ML) SYRINGE FOR IV PUSH (FOR BLOOD PRESSURE SUPPORT)
PREFILLED_SYRINGE | INTRAVENOUS | Status: AC
  Filled 2024-11-14: qty 10

## 2024-11-14 MED ORDER — FENTANYL CITRATE (PF) 100 MCG/2ML IJ SOLN
INTRAMUSCULAR | Status: AC
  Filled 2024-11-14: qty 2

## 2024-11-14 MED ORDER — LIDOCAINE HCL (PF) 2 % IJ SOLN
INTRAMUSCULAR | Status: AC
  Filled 2024-11-14: qty 5

## 2024-11-14 SURGICAL SUPPLY — 32 items
BLADE SHAVER TRUDI 4 15 DEG (ENT DISPOSABLE) IMPLANT
BLADE SHAVER TRUDI STR 4 (ENT DISPOSABLE) ×2 IMPLANT
CABLE TRUDI DISPOSABLE (ENT DISPOSABLE) ×4 IMPLANT
CANISTER SUCT 1200ML W/VALVE (MISCELLANEOUS) ×2 IMPLANT
CATH IV 18X1 1/4 SAFELET (CATHETERS) ×2 IMPLANT
COAGULATOR SUCT 8FR VV (MISCELLANEOUS) ×2 IMPLANT
CUP MEDICINE 2OZ PLAST GRAD ST (MISCELLANEOUS) ×2 IMPLANT
ELECTRODE REM PT RTRN 9FT ADLT (ELECTROSURGICAL) ×2 IMPLANT
GLOVE BIO SURGEON STRL SZ 6.5 (GLOVE) IMPLANT
GLOVE SURG GAMMEX PI TX LF 7.5 (GLOVE) ×4 IMPLANT
GOWN STRL REUS W/ TWL LRG LVL3 (GOWN DISPOSABLE) ×2 IMPLANT
IV NS 500ML BAXH (IV SOLUTION) ×2 IMPLANT
KIT TURNOVER KIT A (KITS) ×2 IMPLANT
NDL ANESTHESIA 27G X 3.5 (NEEDLE) ×2 IMPLANT
NDL HYPO 27GX1-1/4 (NEEDLE) ×2 IMPLANT
NEEDLE ANESTHESIA 27G X 3.5 (NEEDLE) ×2 IMPLANT
NEEDLE HYPO 27GX1-1/4 (NEEDLE) ×2 IMPLANT
NS IRRIG 500ML POUR BTL (IV SOLUTION) ×2 IMPLANT
PACK ENT CUSTOM (PACKS) ×2 IMPLANT
PACKING NASAL EPIS 4X2.4 XEROG (MISCELLANEOUS) ×4 IMPLANT
PATTIES SURGICAL .5 X3 (DISPOSABLE) ×2 IMPLANT
SOLUTION ANTFG W/FOAM PAD STRL (MISCELLANEOUS) ×2 IMPLANT
SPLINT NASAL SEPTAL BLV .50 ST (MISCELLANEOUS) ×2 IMPLANT
STRAP BODY AND KNEE 60X3 (MISCELLANEOUS) ×4 IMPLANT
SUT ETHILON 3-0 KS 30 BLK (SUTURE) ×2 IMPLANT
SUT PLAIN GUT 4-0 (SUTURE) ×2 IMPLANT
SUTURE CHRMC 3-0 KS 27XMFL CR (SUTURE) IMPLANT
SYR 3ML LL SCALE MARK (SYRINGE) ×2 IMPLANT
TOWEL OR 17X26 4PK STRL BLUE (TOWEL DISPOSABLE) ×2 IMPLANT
TRACKER DISPOSABLE PAITIENT (MISCELLANEOUS) ×2 IMPLANT
TUBING IRRIGATION BIEN-AIR (TUBING) ×2 IMPLANT
WATER STERILE IRR 250ML POUR (IV SOLUTION) ×2 IMPLANT

## 2024-11-14 NOTE — H&P (Signed)
 H&P has been reviewed and patient reevaluated, no changes necessary. To be downloaded later. Heart shows a regular rate and rhythm, and lungs are clear.

## 2024-11-14 NOTE — Op Note (Signed)
 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  The septoplasty had to be done first to open up the airway so that I could then open up the left ethmoids and left sphenoid sinus to clean out the opacified ethmoid sinus and a fungus ball in the sphenoid sinus  Procedure: With the patient in a comfortable supine position,  general orotracheal anesthesia was induced without difficulty.     The patient received preoperative Afrin spray for topical decongestion and vasoconstriction.  Intravenous prophylactic antibiotics were administered.  At an appropriate level, the patient was placed in a semi-sitting position.  Nasal vibrissae were trimmed.   1% Xylocaine  with 1:100,000 epinephrine , 6 cc's, was infiltrated into the anterior floor of the nose, into the nasal spine region, into the membranous columella, and finally into the submucoperichondrial plane of the septum on both sides.  Several minutes were allowed for this to take effect.  Cottoniod pledgetts soaked in Afrin and 4% Xylocaine  were placed into both nasal cavities and left while the patient was prepped and draped in the standard fashion.  The materials were removed from the nose and observed to be intact and correct in number.  The nose was inspected with a headlight and zero degree scope with the findings as described above.  A left Killian incision was sharply executed and carried down to  the quadrangular cartilage. The mucoperichondrium was elelvated along the quadrangular plate back to the bony-cartilaginous junction. The mucoperiostium was then elevated along the ethmoid plate and the vomer. The boney-catilaginous junction was then split with a freer elevator and the mucoperiosteum was elevated on the opposite side. The mucoperiosteum was then elevated along the maxillary crest as needed to expose the crooked bone of the crest.  Boney spurs of the vomer and maxillary crest were removed with Rudean forceps.  The cartilaginous plate was trimmed along its posterior and inferior borders of about 2 mm of cartilage to free it up inferiorly. Some of the deviated ethmoid plate was then fractured and removed with Takahashi forceps to free up the posterior border of the quadrangular plate and allow it to swing back to the midline. The mucosal flaps were placed back into their anatomic position to allow visualization of the airways. The septum now sat in the midline with an improved airway.  A 3-0 Chromic suture on a Keith needle in used to anchor the inferior septum at the nasal spine with a through and through suture. The mucosal flaps are then sutured together using a through and through whip stitch of 4-0 Plain Gut with a mini-Keith needle. This was used to close the Sentinel incision as well.   The inferior turbinates were then inspected.  The inferior turbinates were then outfractured get better visualization through the nose.  Using the 0 degree scope and a long entellus needle approximately 2 mL of 1% Xylocaine  with epi 1: 100,000 were used for infiltration in the anterior and posterior root of  the middle turbinate.  This was also injected a little into the uncinate process.  The left middle turbinate was then infractured to get into the middle meatus better.  I did not touch the uncinate process as the maxillary sinus was not a problem.  I opened up the bulla ethmoidalis and there was a  large polyp of thick mucus that was feeling the ball ethmoidalis.  Opened up some more posterior ethmoid air cells and followed the fovea ethmoidalis back to the most posterior ethmoid air cell.  I could see where the sphenoid sinus was beneath it and posterior to the lower portion of the posterior ethmoid sinus.  I used the image guided system to visualize these areas and make sure I knew where the anatomic challenges were.  I used the microdebrider to help clean out the anterior middle ethmoid air cells so there is no debris in the sinuses.  With this all cleaned I then lateralized the middle turbinate so that I could see the superior turbinate.  Between the superior turbinate and the ethmoid plate found the opening into the sphenoid sinus.  I used a sphenoid punch to widen this opening so I could see it better.  Inside the sphenoid sinus there was a green-black thick pasty mucus that was filling most of the sinus.  Once I had it opened fairly well I then moved the middle turbinate and superior turbinate back medially again so was up against the septum.  I could then see the opening through the posterior ethmoid and could widen it farther used the Kerrison forceps.  This gave me direct visualization into the sinus for cleaning it.  I used large suctions to try to clean some of it but it was so thick I had to flush it several times to try to loosen it up.  I was finally able to get all of the fungus ball cleaned out of the sinus and it was a good clear sinus.  The ethmoid sphenoid sinuses were now open and clear and could easily drain.  Xerogel was then placed into the opening the sphenoid sinus and into the ethmoid sinus area.  This was wetted to turn it into a gel.  The airways were then visualized and showed open passageways on both sides that were significantly improved compared to before surgery. There was no signifcant bleeding. Nasal splints were applied to both sides of the septum using Xomed 0.56mm  regular sized splints that were trimmed, and then held in position with a 3-0 Nylon through and through suture.  The patient was turned back over to anesthesia, and awakened, extubated, and taken to the PACU in satisfactory condition.  Dispo:   PACU to home  Plan: Ice, elevation, narcotic analgesia, steroid taper, and prophylactic antibiotics for the duration of indwelling nasal foreign bodies.  We will reevaluate the patient in the office in 6 days and remove the septal splints.  Return to work in 10 days, strenuous activities in two weeks.   Rebecca Morris 11/14/2024 2:16 PM

## 2024-11-14 NOTE — Anesthesia Procedure Notes (Signed)
 Procedure Name: Intubation Date/Time: 11/14/2024 12:33 PM  Performed by: Niki Manus SAUNDERS, CRNAPre-anesthesia Checklist: Patient identified, Patient being monitored, Timeout performed, Emergency Drugs available and Suction available Patient Re-evaluated:Patient Re-evaluated prior to induction Oxygen  Delivery Method: Circle system utilized Preoxygenation: Pre-oxygenation with 100% oxygen  Induction Type: IV induction Ventilation: Mask ventilation without difficulty Laryngoscope Size: Mac and 3 Grade View: Grade I Tube type: Oral Tube size: 7.0 mm Number of attempts: 1 Airway Equipment and Method: Stylet Placement Confirmation: ETT inserted through vocal cords under direct vision, positive ETCO2 and breath sounds checked- equal and bilateral Secured at: 21 cm Tube secured with: Tape Dental Injury: Teeth and Oropharynx as per pre-operative assessment

## 2024-11-14 NOTE — Anesthesia Postprocedure Evaluation (Signed)
 Anesthesia Post Note  Patient: Rebecca Morris  Procedure(s) Performed: SINUS SURGERY, WITH IMAGING GUIDANCE (Bilateral: Nose) SEPTOPLASTY, NOSE (Bilateral: Nose) SINUS SURGERY, ENDOSCOPIC (Left: Nose) REDUCTION, NASAL TURBINATE (Bilateral: Nose)  Anesthesia Type: General Anesthetic complications: no   No notable events documented.   Last Vitals:  Vitals:   11/14/24 1453 11/14/24 1500  BP:  (!) 137/47  Pulse: 93 96  Resp: 12 10  Temp:    SpO2: 93% 96%    Last Pain:  Vitals:   11/14/24 1500  TempSrc:   PainSc: 0-No pain                 Keno Caraway C Edris Friedt

## 2024-11-14 NOTE — Progress Notes (Signed)
 Patient desaturating into the mid 80's. Anesthesia made aware. Note new orders

## 2024-11-14 NOTE — Transfer of Care (Signed)
 Immediate Anesthesia Transfer of Care Note  Patient: Rebecca Morris  Procedure(s) Performed: SINUS SURGERY, WITH IMAGING GUIDANCE (Bilateral: Nose) SEPTOPLASTY, NOSE (Bilateral: Nose) SINUS SURGERY, ENDOSCOPIC (Left: Nose) REDUCTION, NASAL TURBINATE (Bilateral: Nose)  Patient Location: PACU  Anesthesia Type: General ETT  Level of Consciousness: awake, alert  and patient cooperative  Airway and Oxygen  Therapy: Patient Spontanous Breathing and Patient connected to supplemental oxygen   Post-op Assessment: Post-op Vital signs reviewed, Patient's Cardiovascular Status Stable, Respiratory Function Stable, Patent Airway and No signs of Nausea or vomiting  Post-op Vital Signs: Reviewed and stable  Complications: No notable events documented.

## 2024-11-18 LAB — SURGICAL PATHOLOGY
